# Patient Record
Sex: Female | Born: 1965 | Race: Black or African American | Hispanic: No | Marital: Single | Smoking: Never smoker
Health system: Southern US, Community
[De-identification: ages and names within clinical notes are randomized; demographics above are authoritative.]

## PROBLEM LIST (undated history)

## (undated) DIAGNOSIS — M5030 Other cervical disc degeneration, unspecified cervical region: Secondary | ICD-10-CM

## (undated) DIAGNOSIS — L810 Postinflammatory hyperpigmentation: Secondary | ICD-10-CM

## (undated) DIAGNOSIS — Z0282 Encounter for adoption services: Secondary | ICD-10-CM

## (undated) DIAGNOSIS — L219 Seborrheic dermatitis, unspecified: Secondary | ICD-10-CM

## (undated) DIAGNOSIS — L910 Hypertrophic scar: Secondary | ICD-10-CM

## (undated) DIAGNOSIS — Z789 Other specified health status: Secondary | ICD-10-CM

## (undated) DIAGNOSIS — D0510 Intraductal carcinoma in situ of unspecified breast: Secondary | ICD-10-CM

## (undated) DIAGNOSIS — T8859XA Other complications of anesthesia, initial encounter: Secondary | ICD-10-CM

## (undated) DIAGNOSIS — K7689 Other specified diseases of liver: Secondary | ICD-10-CM

## (undated) DIAGNOSIS — C50919 Malignant neoplasm of unspecified site of unspecified female breast: Secondary | ICD-10-CM

## (undated) DIAGNOSIS — D1800 Hemangioma unspecified site: Secondary | ICD-10-CM

## (undated) DIAGNOSIS — Z1371 Encounter for nonprocreative screening for genetic disease carrier status: Secondary | ICD-10-CM

## (undated) DIAGNOSIS — D251 Intramural leiomyoma of uterus: Secondary | ICD-10-CM

## (undated) DIAGNOSIS — R87610 Atypical squamous cells of undetermined significance on cytologic smear of cervix (ASC-US): Secondary | ICD-10-CM

## (undated) DIAGNOSIS — K219 Gastro-esophageal reflux disease without esophagitis: Secondary | ICD-10-CM

## (undated) DIAGNOSIS — Z8489 Family history of other specified conditions: Secondary | ICD-10-CM

## (undated) DIAGNOSIS — T4145XA Adverse effect of unspecified anesthetic, initial encounter: Secondary | ICD-10-CM

## (undated) DIAGNOSIS — M6780 Other specified disorders of synovium and tendon, unspecified site: Secondary | ICD-10-CM

## (undated) DIAGNOSIS — Z86718 Personal history of other venous thrombosis and embolism: Secondary | ICD-10-CM

## (undated) DIAGNOSIS — N946 Dysmenorrhea, unspecified: Secondary | ICD-10-CM

## (undated) HISTORY — DX: Other specified diseases of liver: K76.89

## (undated) HISTORY — DX: Encounter for nonprocreative screening for genetic disease carrier status: Z13.71

## (undated) HISTORY — DX: Atypical squamous cells of undetermined significance on cytologic smear of cervix (ASC-US): R87.610

## (undated) HISTORY — DX: Dysmenorrhea, unspecified: N94.6

## (undated) HISTORY — DX: Postinflammatory hyperpigmentation: L81.0

## (undated) HISTORY — PX: CHOLECYSTECTOMY: SHX55

## (undated) HISTORY — PX: KNEE ARTHROSCOPY: SUR90

## (undated) HISTORY — DX: Intraductal carcinoma in situ of unspecified breast: D05.10

## (undated) HISTORY — DX: Hemangioma unspecified site: D18.00

## (undated) HISTORY — DX: Other cervical disc degeneration, unspecified cervical region: M50.30

## (undated) HISTORY — DX: Intramural leiomyoma of uterus: D25.1

## (undated) HISTORY — DX: Other specified disorders of synovium and tendon, unspecified site: M67.80

## (undated) HISTORY — DX: Hypertrophic scar: L91.0

## (undated) HISTORY — DX: Seborrheic dermatitis, unspecified: L21.9

---

## 2005-01-24 ENCOUNTER — Ambulatory Visit: Payer: Self-pay | Admitting: General Surgery

## 2005-02-06 ENCOUNTER — Ambulatory Visit: Payer: Self-pay | Admitting: General Surgery

## 2005-07-11 ENCOUNTER — Ambulatory Visit: Payer: Self-pay | Admitting: General Surgery

## 2006-08-28 ENCOUNTER — Ambulatory Visit: Payer: Self-pay | Admitting: General Surgery

## 2008-10-20 DIAGNOSIS — C50919 Malignant neoplasm of unspecified site of unspecified female breast: Secondary | ICD-10-CM

## 2008-10-20 HISTORY — DX: Malignant neoplasm of unspecified site of unspecified female breast: C50.919

## 2008-10-20 HISTORY — PX: COMPLETE MASTECTOMY W/ SENTINEL NODE BIOPSY: SHX1383

## 2009-10-20 DIAGNOSIS — Z86718 Personal history of other venous thrombosis and embolism: Secondary | ICD-10-CM

## 2009-10-20 HISTORY — PX: BREAST RECONSTRUCTION: SHX9

## 2009-10-20 HISTORY — DX: Personal history of other venous thrombosis and embolism: Z86.718

## 2010-04-05 DIAGNOSIS — Z1371 Encounter for nonprocreative screening for genetic disease carrier status: Secondary | ICD-10-CM

## 2010-04-05 HISTORY — DX: Encounter for nonprocreative screening for genetic disease carrier status: Z13.71

## 2012-09-28 DIAGNOSIS — M6780 Other specified disorders of synovium and tendon, unspecified site: Secondary | ICD-10-CM

## 2012-09-28 HISTORY — DX: Other specified disorders of synovium and tendon, unspecified site: M67.80

## 2012-12-16 ENCOUNTER — Ambulatory Visit (HOSPITAL_COMMUNITY)
Admission: RE | Admit: 2012-12-16 | Discharge: 2012-12-16 | Disposition: A | Discharge status: Home / Self Care | Payer: Self-pay | Source: Ambulatory Visit | Attending: Internal Medicine | Admitting: Internal Medicine

## 2012-12-16 ENCOUNTER — Encounter (HOSPITAL_COMMUNITY): Payer: Self-pay | Admitting: Emergency Medicine

## 2012-12-16 ENCOUNTER — Emergency Department (HOSPITAL_COMMUNITY)
Admission: EM | Admit: 2012-12-16 | Discharge: 2012-12-16 | Disposition: A | Discharge status: Home / Self Care | Payer: Self-pay | Attending: Emergency Medicine | Admitting: Emergency Medicine

## 2012-12-16 DIAGNOSIS — Z862 Personal history of diseases of the blood and blood-forming organs and certain disorders involving the immune mechanism: Secondary | ICD-10-CM | POA: Insufficient documentation

## 2012-12-16 DIAGNOSIS — C50919 Malignant neoplasm of unspecified site of unspecified female breast: Secondary | ICD-10-CM | POA: Insufficient documentation

## 2012-12-16 DIAGNOSIS — M79609 Pain in unspecified limb: Secondary | ICD-10-CM | POA: Insufficient documentation

## 2012-12-16 DIAGNOSIS — Z853 Personal history of malignant neoplasm of breast: Secondary | ICD-10-CM | POA: Insufficient documentation

## 2012-12-16 DIAGNOSIS — Z86718 Personal history of other venous thrombosis and embolism: Secondary | ICD-10-CM | POA: Insufficient documentation

## 2012-12-16 DIAGNOSIS — M7989 Other specified soft tissue disorders: Secondary | ICD-10-CM

## 2012-12-16 DIAGNOSIS — Z79899 Other long term (current) drug therapy: Secondary | ICD-10-CM | POA: Insufficient documentation

## 2012-12-16 DIAGNOSIS — M79605 Pain in left leg: Secondary | ICD-10-CM

## 2012-12-16 HISTORY — DX: Malignant neoplasm of unspecified site of unspecified female breast: C50.919

## 2012-12-16 HISTORY — DX: Personal history of other venous thrombosis and embolism: Z86.718

## 2012-12-16 LAB — COMPREHENSIVE METABOLIC PANEL
Albumin: 3.6 g/dL (ref 3.5–5.2)
Alkaline Phosphatase: 77 U/L (ref 39–117)
BUN: 14 mg/dL (ref 6–23)
Calcium: 9.1 mg/dL (ref 8.4–10.5)
GFR calc Af Amer: >90 mL/min (ref >90)
Glucose, Bld: 97 mg/dL (ref 70–99)
Potassium: 4 mEq/L (ref 3.5–5.1)
Sodium: 137 mEq/L (ref 135–145)
Total Protein: 7.3 g/dL (ref 6.0–8.3)

## 2012-12-16 LAB — CBC WITH DIFFERENTIAL/PLATELET
Basophils Absolute: 0 10*3/uL (ref 0.0–0.1)
Basophils Relative: 0 % (ref 0–1)
Eosinophils Absolute: 0.1 10*3/uL (ref 0.0–0.7)
Eosinophils Relative: 1 % (ref 0–5)
HCT: 39.2 % (ref 36.0–46.0)
Hemoglobin: 13.4 g/dL (ref 12.0–15.0)
Lymphocytes Relative: 26 % (ref 12–46)
Lymphs Abs: 1.9 10*3/uL (ref 0.7–4.0)
MCH: 31.8 pg (ref 26.0–34.0)
MCHC: 34.2 g/dL (ref 30.0–36.0)
MCV: 93.1 fL (ref 78.0–100.0)
Monocytes Absolute: 0.5 10*3/uL (ref 0.1–1.0)
Monocytes Relative: 7 % (ref 3–12)
Neutro Abs: 4.6 10*3/uL (ref 1.7–7.7)
Neutrophils Relative %: 66 % (ref 43–77)
Platelets: 287 10*3/uL (ref 150–400)
RBC: 4.21 MIL/uL (ref 3.87–5.11)
RDW: 12.2 % (ref 11.5–15.5)
WBC: 7 10*3/uL (ref 4.0–10.5)

## 2012-12-16 MED ORDER — ENOXAPARIN SODIUM 80 MG/0.8ML ~~LOC~~ SOLN
80.0000 mg | Freq: Once | SUBCUTANEOUS | Status: AC
  Administered 2012-12-16: 80 mg via SUBCUTANEOUS
  Filled 2012-12-16: qty 0.8

## 2012-12-16 NOTE — ED Notes (Signed)
Patient complaining of left leg swelling behind knee and pain in left calf/shin that started this morning; patient reports that she has had a history of blood clots in her left arm (one year ago).  Patient denies taking blood thinners.

## 2012-12-16 NOTE — ED Provider Notes (Signed)
History     CSN: [id number]  Arrival date & time 12/16/12  0217   First MD Initiated Contact with Patient 12/16/12 [phone]      Chief Complaint  Patient presents with  . Leg Swelling    (Consider location/radiation/quality/duration/timing/severity/associated sxs/prior treatment) HPI Comments: Beth Branch is a 47 y.o. female with a history of breast cancer bilateral mastectomy and postsurgical DVT presents emergency department complaining of left lower the posterior knee and/and pain.  Onset of symptoms began yesterday and persisted through the morning.  Patient denies any warmth, swelling, erythema or edema of extremity, but she does report that pain is similar to the type of pain she had with her previous blood clot.  Patient is currently not on blood thinners and denies chest pain, shortness of breath, palpitations, cough, hemoptysis.  Patient has a followup appointment scheduled with her oncologist on Friday to the long drive and she decided to get evaluated prior to the long drive as she knows the risks of having DVTs.  No other complaints at this time.  The history is provided by the patient.    Past Medical History  Diagnosis Date  . History of blood clots   . Breast cancer     Past Surgical History  Procedure Laterality Date  . Breast surgery      History reviewed. No pertinent family history.  History  Substance Use Topics  . Smoking status: Never Smoker   . Smokeless tobacco: Not on file  . Alcohol Use: No    OB History   Grav Para Term Preterm Abortions TAB SAB Ect Mult Living                  Review of Systems  All other systems reviewed and are negative.    Allergies  Review of patient's allergies indicates no known allergies.  Home Medications   Current Outpatient Rx  Name  Route  Sig  Dispense  Refill  . minocycline (MINOCIN,DYNACIN) 100 MG capsule   Oral   Take 100 mg by mouth 2 (two) times daily. For acne prevention         . tretinoin  (RETIN-A) 0.05 % cream   Topical   Apply 1 application topically at bedtime.           BP 142/72  Pulse 98  Temp(Src) 99.1 F (37.3 C) (Oral)  Resp 16  SpO2 100%  LMP 12/16/2012  Physical Exam  Nursing note and vitals reviewed. Constitutional: She is oriented to person, place, and time. She appears well-developed and well-nourished. No distress.  HENT:  Head: Normocephalic and atraumatic.  Eyes: Conjunctivae and EOM are normal.  Neck: Normal range of motion. Neck supple.  Cardiovascular:  Intact distal pulses, capillary refill < 3 seconds.  No pitting edema  Pulmonary/Chest: Effort normal.  Musculoskeletal: Normal range of motion.  All extremities with normal ROM.  Left lower extremity nontender to palpation.  Neurological: She is alert and oriented to person, place, and time.  No sensory deficit  Skin: Skin is warm and dry. No rash noted. She is not diaphoretic.  No warmth, erythema or edema seen  Psychiatric: She has a normal mood and affect. Her behavior is normal.    ED Course  Procedures (including critical care time)  Labs Reviewed  CBC WITH DIFFERENTIAL  COMPREHENSIVE METABOLIC PANEL   No results found.   No diagnosis found.    MDM  Patient is a 47 year old female with a history of breast cancer  and a blood clot status post mastectomy that presents to the emergency department complaining of left lower trauma the posterior knee calf/shin pain that began yesterday with mild associated swelling that has since resolved.  Based on patient's history she'll be discharged with DVT treatment and return for ultrasound in the morning.  Case discussed with attending Dr. Freida Busman who agrees with treatment plan.  Patient throughout hospital stay and at time of discharge has no signs or concerns for pulmonary embolism including chest pain, shortness of breath, tachycardia, cough, hemoptysis or hypoxia.  Patient is currently stable and in no acute  distress.        Jaci Carrel, [place] 12/16/12 [phone]

## 2012-12-16 NOTE — Progress Notes (Signed)
Left:  No evidence of DVT, superficial thrombosis, or Baker's cyst.  Right:  Negative for DVT in the common femoral vein.  

## 2012-12-16 NOTE — ED Notes (Signed)
Pt discharged.Vital signs stable and GCS 15 

## 2012-12-16 NOTE — ED Notes (Signed)
The pt has a sl red swollen area to the popiteal area of her lt leg.  Temperature of that area the same as the rest of the leg and the other leg.. She does not remember a biteand she does not feel real pain just an aching intermittently just today.

## 2012-12-20 NOTE — ED Provider Notes (Signed)
Medical screening examination/treatment/procedure(s) were conducted as a shared visit with non-physician practitioner(s) and myself.  I personally evaluated the patient during the encounter  Toy Baker, MD 12/20/12 [phone]

## 2013-08-15 ENCOUNTER — Ambulatory Visit: Payer: Self-pay | Admitting: General Surgery

## 2013-09-13 ENCOUNTER — Encounter: Payer: Self-pay | Admitting: *Deleted

## 2013-10-11 DIAGNOSIS — Z124 Encounter for screening for malignant neoplasm of cervix: Secondary | ICD-10-CM | POA: Insufficient documentation

## 2013-10-11 DIAGNOSIS — Z853 Personal history of malignant neoplasm of breast: Secondary | ICD-10-CM | POA: Insufficient documentation

## 2013-10-11 DIAGNOSIS — Z6828 Body mass index (BMI) 28.0-28.9, adult: Secondary | ICD-10-CM | POA: Insufficient documentation

## 2013-10-11 DIAGNOSIS — D251 Intramural leiomyoma of uterus: Secondary | ICD-10-CM | POA: Insufficient documentation

## 2013-10-11 DIAGNOSIS — N946 Dysmenorrhea, unspecified: Secondary | ICD-10-CM

## 2013-10-11 HISTORY — DX: Dysmenorrhea, unspecified: N94.6

## 2013-10-11 HISTORY — DX: Intramural leiomyoma of uterus: D25.1

## 2013-10-12 DIAGNOSIS — Z9889 Other specified postprocedural states: Secondary | ICD-10-CM | POA: Insufficient documentation

## 2013-12-12 DIAGNOSIS — L810 Postinflammatory hyperpigmentation: Secondary | ICD-10-CM

## 2013-12-12 DIAGNOSIS — L709 Acne, unspecified: Secondary | ICD-10-CM | POA: Insufficient documentation

## 2013-12-12 HISTORY — DX: Postinflammatory hyperpigmentation: L81.0

## 2013-12-26 DIAGNOSIS — L910 Hypertrophic scar: Secondary | ICD-10-CM

## 2013-12-26 HISTORY — DX: Hypertrophic scar: L91.0

## 2014-03-01 DIAGNOSIS — L02413 Cutaneous abscess of right upper limb: Secondary | ICD-10-CM | POA: Insufficient documentation

## 2014-03-01 DIAGNOSIS — L219 Seborrheic dermatitis, unspecified: Secondary | ICD-10-CM

## 2014-03-01 HISTORY — DX: Seborrheic dermatitis, unspecified: L21.9

## 2014-03-30 ENCOUNTER — Ambulatory Visit: Payer: Self-pay | Admitting: Emergency Medicine

## 2014-04-18 ENCOUNTER — Ambulatory Visit: Payer: Self-pay | Admitting: Internal Medicine

## 2014-12-30 ENCOUNTER — Ambulatory Visit: Payer: Self-pay | Admitting: Family Medicine

## 2015-01-04 ENCOUNTER — Ambulatory Visit: Payer: Self-pay | Admitting: Family Medicine

## 2015-08-20 DIAGNOSIS — R87610 Atypical squamous cells of undetermined significance on cytologic smear of cervix (ASC-US): Secondary | ICD-10-CM

## 2015-08-20 DIAGNOSIS — N898 Other specified noninflammatory disorders of vagina: Secondary | ICD-10-CM | POA: Insufficient documentation

## 2015-08-20 HISTORY — DX: Atypical squamous cells of undetermined significance on cytologic smear of cervix (ASC-US): R87.610

## 2015-08-27 ENCOUNTER — Ambulatory Visit
Admission: EM | Admit: 2015-08-27 | Discharge: 2015-08-27 | Disposition: A | Discharge status: Home / Self Care | Payer: No Typology Code available for payment source | Attending: Family Medicine | Admitting: Family Medicine

## 2015-08-27 DIAGNOSIS — M25512 Pain in left shoulder: Secondary | ICD-10-CM | POA: Diagnosis not present

## 2015-08-27 DIAGNOSIS — M79622 Pain in left upper arm: Secondary | ICD-10-CM

## 2015-08-27 MED ORDER — IBUPROFEN 800 MG PO TABS: 800.0000 mg | ORAL_TABLET | Freq: Three times a day (TID) | ORAL | Status: DC | PRN

## 2015-08-27 NOTE — Discharge Instructions (Signed)
Cryotherapy °Cryotherapy means treatment with cold. Ice or gel packs can be used to reduce both pain and swelling. Ice is the most helpful within the first 24 to 48 hours after an injury or flare-up from overusing a muscle or joint. Sprains, strains, spasms, burning pain, shooting pain, and aches can all be eased with ice. Ice can also be used when recovering from surgery. Ice is effective, has very few side effects, and is safe for most people to use. °PRECAUTIONS  °Ice is not a safe treatment option for people with: °· Raynaud phenomenon. This is a condition affecting small blood vessels in the extremities. Exposure to cold may cause your problems to return. °· Cold hypersensitivity. There are many forms of cold hypersensitivity, including: °· Cold urticaria. Red, itchy hives appear on the skin when the tissues begin to warm after being iced. °· Cold erythema. This is a red, itchy rash caused by exposure to cold. °· Cold hemoglobinuria. Red blood cells break down when the tissues begin to warm after being iced. The hemoglobin that carry oxygen are passed into the urine because they cannot combine with blood proteins fast enough. °· Numbness or altered sensitivity in the area being iced. °If you have any of the following conditions, do not use ice until you have discussed cryotherapy with your caregiver: °· Heart conditions, such as arrhythmia, angina, or chronic heart disease. °· High blood pressure. °· Healing wounds or open skin in the area being iced. °· Current infections. °· Rheumatoid arthritis. °· Poor circulation. °· Diabetes. °Ice slows the blood flow in the region it is applied. This is beneficial when trying to stop inflamed tissues from spreading irritating chemicals to surrounding tissues. However, if you expose your skin to cold temperatures for too long or without the proper protection, you can damage your skin or nerves. Watch for signs of skin damage due to cold. °HOME CARE INSTRUCTIONS °Follow  these tips to use ice and cold packs safely. °· Place a dry or damp towel between the ice and skin. A damp towel will cool the skin more quickly, so you may need to shorten the time that the ice is used. °· For a more rapid response, add gentle compression to the ice. °· Ice for no more than 10 to 20 minutes at a time. The bonier the area you are icing, the less time it will take to get the benefits of ice. °· Check your skin after 5 minutes to make sure there are no signs of a poor response to cold or skin damage. °· Rest 20 minutes or more between uses. °· Once your skin is numb, you can end your treatment. You can test numbness by very lightly touching your skin. The touch should be so light that you do not see the skin dimple from the pressure of your fingertip. When using ice, most people will feel these normal sensations in this order: cold, burning, aching, and numbness. °· Do not use ice on someone who cannot communicate their responses to pain, such as small children or people with dementia. °HOW TO MAKE AN ICE PACK °Ice packs are the most common way to use ice therapy. Other methods include ice massage, ice baths, and cryosprays. Muscle creams that cause a cold, tingly feeling do not offer the same benefits that ice offers and should not be used as a substitute unless recommended by your caregiver. °To make an ice pack, do one of the following: °· Place crushed ice or a   bag of frozen vegetables in a sealable plastic bag. Squeeze out the excess air. Place this bag inside another plastic bag. Slide the bag into a pillowcase or place a damp towel between your skin and the bag.  Mix 3 parts water with 1 part rubbing alcohol. Freeze the mixture in a sealable plastic bag. When you remove the mixture from the freezer, it will be slushy. Squeeze out the excess air. Place this bag inside another plastic bag. Slide the bag into a pillowcase or place a damp towel between your skin and the bag. SEEK MEDICAL CARE  IF:  You develop white spots on your skin. This may give the skin a blotchy (mottled) appearance.  Your skin turns blue or pale.  Your skin becomes waxy or hard.  Your swelling gets worse. MAKE SURE YOU:   Understand these instructions.  Will watch your condition.  Will get help right away if you are not doing well or get worse.   This information is not intended to replace advice given to you by your health care provider. Make sure you discuss any questions you have with your health care provider.   Document Released: 06/02/2011 Document Revised: 10/27/2014 Document Reviewed: 06/02/2011 Elsevier Interactive Patient Education 2016 [place]. Muscle Strain A muscle strain is an injury that occurs when a muscle is stretched beyond its normal length. Usually a small number of muscle fibers are torn when this happens. Muscle strain is rated in degrees. First-degree strains have the least amount of muscle fiber tearing and pain. Second-degree and third-degree strains have increasingly more tearing and pain.  Usually, recovery from muscle strain takes 1-2 weeks. Complete healing takes 5-6 weeks.  CAUSES  Muscle strain happens when a sudden, violent force placed on a muscle stretches it too far. This may occur with lifting, sports, or a fall.  RISK FACTORS Muscle strain is especially common in athletes.  SIGNS AND SYMPTOMS At the site of the muscle strain, there may be:  Pain.  Bruising.  Swelling.  Difficulty using the muscle due to pain or lack of normal function. DIAGNOSIS  Your health care provider will perform a physical exam and ask about your medical history. TREATMENT  Often, the best treatment for a muscle strain is resting, icing, and applying cold compresses to the injured area.  HOME CARE INSTRUCTIONS   Use the PRICE method of treatment to promote muscle healing during the first 2-3 days after your injury. The PRICE method involves:  Protecting the muscle from  being injured again.  Restricting your activity and resting the injured body part.  Icing your injury. To do this, put ice in a plastic bag. Place a towel between your skin and the bag. Then, apply the ice and leave it on from 15-20 minutes each hour. After the third day, switch to moist heat packs.  Apply compression to the injured area with a splint or elastic bandage. Be careful not to wrap it too tightly. This may interfere with blood circulation or increase swelling.  Elevate the injured body part above the level of your heart as often as you can.  Only take over-the-counter or prescription medicines for pain, discomfort, or fever as directed by your health care provider.  Warming up prior to exercise helps to prevent future muscle strains. SEEK MEDICAL CARE IF:   You have increasing pain or swelling in the injured area.  You have numbness, tingling, or a significant loss of strength in the injured area. MAKE SURE YOU:  Understand these instructions.  Will watch your condition.  Will get help right away if you are not doing well or get worse.   This information is not intended to replace advice given to you by your health care provider. Make sure you discuss any questions you have with your health care provider.   Document Released: 10/06/2005 Document Revised: 07/27/2013 Document Reviewed: 05/05/2013 Elsevier Interactive Patient Education 2016 [place] A contusion is a deep bruise. Contusions are the result of a blunt injury to tissues and muscle fibers under the skin. The injury causes bleeding under the skin. The skin overlying the contusion may turn blue, purple, or yellow. Minor injuries will give you a painless contusion, but more severe contusions may stay painful and swollen for a few weeks.  CAUSES  This condition is usually caused by a blow, trauma, or direct force to an area of the body. SYMPTOMS  Symptoms of this condition include:  Swelling of  the injured area.  Pain and tenderness in the injured area.  Discoloration. The area may have redness and then turn blue, purple, or yellow. DIAGNOSIS  This condition is diagnosed based on a physical exam and medical history. An X-ray, CT scan, or MRI may be needed to determine if there are any associated injuries, such as broken bones (fractures). TREATMENT  Specific treatment for this condition depends on what area of the body was injured. In general, the best treatment for a contusion is resting, icing, applying pressure to (compression), and elevating the injured area. This is often called the RICE strategy. Over-the-counter anti-inflammatory medicines may also be recommended for pain control.  HOME CARE INSTRUCTIONS   Rest the injured area.  If directed, apply ice to the injured area:  Put ice in a plastic bag.  Place a towel between your skin and the bag.  Leave the ice on for 20 minutes, 2-3 times per day.  If directed, apply light compression to the injured area using an elastic bandage. Make sure the bandage is not wrapped too tightly. Remove and reapply the bandage as directed by your health care provider.  If possible, raise (elevate) the injured area above the level of your heart while you are sitting or lying down.  Take over-the-counter and prescription medicines only as told by your health care provider. SEEK MEDICAL CARE IF:  Your symptoms do not improve after several days of treatment.  Your symptoms get worse.  You have difficulty moving the injured area. SEEK IMMEDIATE MEDICAL CARE IF:   You have severe pain.  You have numbness in a hand or foot.  Your hand or foot turns pale or cold.   This information is not intended to replace advice given to you by your health care provider. Make sure you discuss any questions you have with your health care provider.   Document Released: 07/16/2005 Document Revised: 06/27/2015 Document Reviewed: 02/21/2015 Elsevier  Interactive Patient Education [employer].

## 2015-08-27 NOTE — ED Notes (Signed)
Patient states that she had breast cancer in right breast cancer 5 years ago and she had a mastectomy on both sides. She states that she went for her annual exam and she states that they wanted to ultrasound the left axillary area and she had an area that they said was thickened but that it looked more vascular. She states that since that appointment she has had pain that has worsened since her ultrasound. She states that her Doctor is in [place] and she has not been responding to patient calls or emails. Patient states that she is worried and would like for someone here to just take a peek at the area. Patient states that pain radiates into chest and in arm. She states that the pain is more of an ache.

## 2015-08-28 ENCOUNTER — Ambulatory Visit
Admission: RE | Admit: 2015-08-28 | Discharge: 2015-08-28 | Disposition: A | Discharge status: Home / Self Care | Payer: No Typology Code available for payment source | Source: Ambulatory Visit | Attending: Registered Nurse | Admitting: Registered Nurse

## 2015-08-28 DIAGNOSIS — M79622 Pain in left upper arm: Secondary | ICD-10-CM | POA: Insufficient documentation

## 2015-08-28 NOTE — ED Provider Notes (Signed)
CSN: [id number]     Arrival date & time 08/27/15  1722 History   First MD Initiated Contact with Patient 08/27/15 1842     Chief Complaint  Patient presents with  . Axillary Pain    (Consider location/radiation/quality/duration/timing/severity/associated sxs/prior Treatment) HPI Comments: African [place] female here for evaluation of swelling and pain left axilla that started after her follow up appt at [place] with her oncologist 18 Aug 2015 Dr Oppang  [place] 28 Oct.  Patient and provider unable to feel same lump she identified for her breast cancer follow up appt [place] ordered and completed by multiple techs/radiologist and clinical breast exams performed by multiple providers that week.  Lump identified at 1 oclock position superiorolateral breast tissue Patient has had history of folliculitis in left armpit.  She is unable to feel lump she identified for oncologist now resolved after LMP 10 Aug 2015.  Was told 3mm probable vascular change in area from previous exams and to follow up in 90 days for repeat evaluation but at the time was not painful or swollen.  Patient concerned something has changed and wants re-evaluation today due to swelling and pain left axilla became sore/puffy and tender last Tuesday.  Loose bowels this past week also over a couple days now resolved.  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    Past Medical History  Diagnosis Date  . History of blood clots   . Breast cancer [facility])    Past Surgical History  Procedure Laterality Date  . Complete mastectomy w/ sentinel node biopsy Bilateral 2010   Family History  Problem Relation Age of Onset  . Adopted: Yes   Social History  Substance Use Topics  .  Smoking status: Never Smoker   . Smokeless tobacco: None  . Alcohol Use: No   OB History    No data available     Review of Systems  Constitutional: Negative for fever, chills, diaphoresis, activity change, appetite change, fatigue and unexpected weight change.  HENT: Negative for congestion, dental problem, drooling, ear discharge, ear pain, facial swelling, hearing loss, mouth sores, nosebleeds, postnasal drip, sore throat, trouble swallowing and voice change.   Eyes: Negative for photophobia, pain, discharge, redness, itching and visual disturbance.  Respiratory: Negative for cough, choking, chest tightness, shortness of breath, wheezing and stridor.   Cardiovascular: Negative for chest pain, palpitations and leg swelling.  Gastrointestinal: Positive for diarrhea. Negative for nausea, vomiting, abdominal pain, constipation, blood in stool and abdominal distention.  Endocrine: Negative for cold intolerance and heat intolerance.  Genitourinary: Negative for dysuria, hematuria and difficulty urinating.  Musculoskeletal: Positive for myalgias. Negative for back pain, joint swelling, arthralgias, gait problem, neck pain and neck stiffness.  Skin: Negative for color change, pallor, rash and wound.  Allergic/Immunologic: Negative for environmental allergies, food allergies and immunocompromised state.  Neurological: Negative for dizziness, tremors, seizures, syncope, facial asymmetry, speech difficulty, weakness, light-headedness, numbness and headaches.  Hematological: Positive for adenopathy. Does not bruise/bleed easily.  Psychiatric/Behavioral: Negative for behavioral problems, confusion, sleep disturbance and agitation.    Allergies  Review of patient's allergies indicates no known allergies.  Home Medications   Prior to Admission medications   Medication Sig Start Date End Date Taking? Authorizing Provider  ibuprofen (ADVIL,MOTRIN) 800 MG tablet Take 1 tablet (800 mg total) by mouth  every 8 (eight) hours as needed for mild pain or moderate pain. 08/27/15   Olen Cordial, NP   Meds Ordered and Administered this Visit  Medications - No data to display  BP 130/90 mmHg  Pulse 84  Temp(Src) 99.5 F (37.5 C) (Tympanic)  Resp 15  Ht 5\' 7"  (1.702 m)  Wt 178 lb (80.74 kg)  BMI 27.87 kg/m2  SpO2 100%  LMP 08/10/2015 (Approximate) No data found.   Physical Exam  Constitutional: She is oriented to person, place, and time. Vital signs are normal. She appears well-developed and well-nourished. She is active and cooperative.  Non-toxic appearance. She does not have a sickly appearance. She does not appear ill. No distress.  HENT:  Head: Normocephalic and atraumatic.  Right Ear: Hearing and external ear normal.  Left Ear: Hearing and external ear normal.  Nose: Nose normal. No mucosal edema, rhinorrhea, nose lacerations, sinus tenderness or nasal deformity. Right sinus exhibits no maxillary sinus tenderness and no frontal sinus tenderness. Left sinus exhibits no maxillary sinus tenderness and no frontal sinus tenderness.  Mouth/Throat: Uvula is midline, oropharynx is clear and moist and mucous membranes are normal. Mucous membranes are not pale, not dry and not cyanotic. She does not have dentures. No oral lesions. No trismus in the jaw. Normal dentition. No dental abscesses, uvula swelling, lacerations or dental caries. No oropharyngeal exudate, posterior oropharyngeal edema, posterior oropharyngeal erythema or tonsillar abscesses.  Eyes: Conjunctivae, EOM and lids are normal. Pupils are equal, round, and reactive to light. Right eye exhibits no chemosis, no discharge, no exudate and no hordeolum. No foreign body present in the right eye. Left eye exhibits no chemosis, no discharge, no exudate and no hordeolum. No foreign body present in the left eye. Right conjunctiva is not injected. Right conjunctiva has no hemorrhage. Left conjunctiva is not injected. Left conjunctiva has no  hemorrhage. No scleral icterus. Right eye exhibits normal extraocular motion and no nystagmus. Left eye exhibits normal extraocular motion and no nystagmus. Right pupil is round and reactive. Left pupil is round and reactive. Pupils are equal.  Neck: Trachea normal and normal range of motion. Neck supple. No tracheal tenderness, no spinous process tenderness and no muscular tenderness present. No rigidity. No tracheal deviation, no edema, no erythema and normal range of motion present. No thyroid mass and no thyromegaly present.  Cardiovascular: Normal rate, regular rhythm, S1 normal, S2 normal, normal heart sounds and intact distal pulses.  PMI is not displaced.  Exam reveals no gallop and no friction rub.   No murmur heard. Pulmonary/Chest: Effort normal and breath sounds normal. No accessory muscle usage or stridor. No respiratory distress. She has no decreased breath sounds. She has no wheezes. She has no rhonchi. She has no rales. She exhibits no tenderness.  Abdominal: Soft. Bowel sounds are normal. She exhibits no shifting dullness, no distension, no pulsatile liver, no fluid wave, no abdominal bruit, no ascites, no pulsatile midline mass and no mass. There is no tenderness. There is no rigidity, no rebound, no guarding, no tenderness at McBurney's point and negative Murphy's sign. Hernia confirmed negative in the ventral area.  Dull to percussion x 4 quads  Musculoskeletal: Normal range of motion. She exhibits edema and tenderness.       Right shoulder: Normal.       Left shoulder: Normal.       Right elbow: Normal.  Left elbow: Normal.       Right wrist: Normal.       Left wrist: Normal.       Cervical back: Normal.       Thoracic back: Normal.       Right upper arm: Normal.       Left upper arm: She exhibits tenderness and swelling. She exhibits no bony tenderness, no edema, no deformity and no laceration.       Right forearm: Normal.       Left forearm: Normal.       Arms:       Right hand: Normal.       Left hand: Normal.  Lymphadenopathy:       Head (right side): No submental, no submandibular, no tonsillar, no preauricular, no posterior auricular and no occipital adenopathy present.       Head (left side): No submental, no submandibular, no tonsillar, no preauricular, no posterior auricular and no occipital adenopathy present.    She has no cervical adenopathy.       Right cervical: No superficial cervical, no deep cervical and no posterior cervical adenopathy present.      Left cervical: No superficial cervical, no deep cervical and no posterior cervical adenopathy present.       Right axillary: No pectoral and no lateral adenopathy present.  0-1+ nonpitting edema anterior ridge axilla superiorolateral to pectoral muscle left  Neurological: She is alert and oriented to person, place, and time. She is not disoriented. She displays no atrophy and no tremor. No cranial nerve deficit or sensory deficit. She exhibits normal muscle tone. She displays no seizure activity. Coordination and gait normal. GCS eye subscore is 4. GCS verbal subscore is 5. GCS motor subscore is 6.  Skin: Skin is warm, dry and intact. No abrasion, no bruising, no burn, no ecchymosis, no laceration, no lesion, no petechiae and no rash noted. She is not diaphoretic. No cyanosis or erythema. No pallor. Nails show no clubbing.     TTP 2cm diameter area superioromedial axilla left 0-1+ nonpitting edema compared to right; central left axilla scar tissue palpated 3-29mm nontender no discernable nodules, papules, vesicles, erythema, ecchymosis  Psychiatric: She has a normal mood and affect. Her speech is normal and behavior is normal. Judgment and thought content normal. Cognition and memory are normal.  Nursing note and vitals reviewed.   ED Course  Procedures (including critical care time)  Labs Review Labs Reviewed - No data to display  Imaging Review [place] Extrem Up Left Ltd  08/28/2015  CLINICAL  DATA:  Left axillary pain. EXAM: ULTRASOUND LEFT UPPER EXTREMITY LIMITED TECHNIQUE: Ultrasound examination of the upper extremity soft tissues was performed in the area of clinical concern. COMPARISON:  No prior. FINDINGS: A 9 mm hypodensity with hyperechoic core consistent with small lymph node is noted in the left axilla. The lymph node has a benign appearance . No other focal abnormality identified. IMPRESSION: Tiny 9 mm lymph node in the left axilla. No other focal abnormality identified . Electronically Signed   By: Marcello Moores  Register   On: 08/28/2015 11:18   1232 28 Aug 2015 Telephone message left for patient regarding [place] results.   Appears benign consistent with her previous US results that she reported from [place].  Patient may pick up copy of radiology and report from [facility] during business hours if she desires copy.  Continue plan of care as previously discussed.     MDM  1. Axillary pain, left    Suspect contusion from prolonged ultrasound 18 Aug 2015 follow up with Dr Mohammed Kindle  [facility] [place]  29 Oct for breast cancer.  DDx muscle strain, recurrent breast cancer, cellulitis, soft tissue injury from prolonged and multiple exams/ultrasonographers end of Oct and patient performing repetitive self axillary exams.  Will call patient with results once available typically 4 hours after scan.  Continue to monitor for bruising, worsening swelling, rash, erythema, fever and follow up for re-evaluation if these symptoms occur.  Keep 90 day follow up with oncologist at [facility] as previously scheduled sooner if changing symptoms.  May take motrin 800mg  po TID prn pain/swelling.  Ice TID to affected area.  Avoid strenuous workouts until symptoms resolved.  Exitcare handout on contusion, muscle strain given to patient.  Patient verbalized understanding of information/instructions, agreed with plan of care and had no further questions at this  time.    Olen Cordial, NP 08/28/15 1242

## 2015-09-04 ENCOUNTER — Encounter: Payer: Self-pay | Admitting: General Surgery

## 2015-09-04 ENCOUNTER — Ambulatory Visit (INDEPENDENT_AMBULATORY_CARE_PROVIDER_SITE_OTHER): Payer: No Typology Code available for payment source | Admitting: General Surgery

## 2015-09-04 VITALS — BP 124/70 | HR 74 | Resp 12 | Ht 67.0 in | Wt 175.0 lb

## 2015-09-04 DIAGNOSIS — R590 Localized enlarged lymph nodes: Secondary | ICD-10-CM

## 2015-09-04 DIAGNOSIS — D0511 Intraductal carcinoma in situ of right breast: Secondary | ICD-10-CM | POA: Diagnosis not present

## 2015-09-04 NOTE — Progress Notes (Signed)
Patient ID: Beth Branch, female   DOB: 1966/05/01, 49 y.o.   MRN: [id number]  Chief Complaint  Patient presents with  . Pain    left axillary area    HPI Beth Branch is a 49 y.o. female.  Here today for evaluation of left axillary pain with lymph node. She states she can feel some nodules in the left axillary area since last month while in [place]. She did have an ultrasound showing several lymph nodes, one of which was considered suspicious (at the 1:00 position). The pain is described as tenderness. She then went to [facility] and had another ultrasound on 08-28-15 showing questionable growth of lymph node. No biopsies have been done at this time.  Hx of Right DCIS post b/l mastectomy and 6 months of tamoxifen. When she was seen for core biopsy of the lymph node on left at 1 ocl location this appeared vascular and very small. # mo f/u was recommended. I have reviewed the history of present illness with the patient. HPI  Past Medical History  Diagnosis Date  . History of blood clots   . Breast cancer [facility]) 2010    right breast DCIS    Past Surgical History  Procedure Laterality Date  . Complete mastectomy w/ sentinel node biopsy Bilateral 2010    Right breast DCIS  . Breast reconstruction Bilateral 2011    Dr Greta Doom in [place]    Family History  Problem Relation Age of Onset  . Adopted: Yes    Social History Social History  Substance Use Topics  . Smoking status: Never Smoker   . Smokeless tobacco: Never Used  . Alcohol Use: No    No Known Allergies  Current Outpatient Prescriptions  Medication Sig Dispense Refill  . b complex vitamins capsule Take by mouth.    Marland Kitchen ibuprofen (ADVIL,MOTRIN) 800 MG tablet Take 1 tablet (800 mg total) by mouth every 8 (eight) hours as needed for mild pain or moderate pain. 30 tablet 0  . Lactobacillus Rhamnosus, GG, (CULTURELLE IMMUNITY SUPPORT) CAPS Take by mouth.    . magnesium 30 MG tablet Take by mouth.    . Multiple  Vitamins-Minerals (HAIR SKIN AND NAILS FORMULA PO) Take by mouth daily.    . Omega-3 Fatty Acids (KP FISH OIL) 1200 MG CAPS Take by mouth.     No current facility-administered medications for this visit.    Review of Systems Review of Systems  Constitutional: Negative.   Respiratory: Negative.   Cardiovascular: Negative.     Blood pressure 124/70, pulse 74, resp. rate 12, height 5\' 7"  (1.702 m), weight 175 lb (79.379 kg), last menstrual period 09/04/2015.  Physical Exam Physical Exam  Constitutional: She is oriented to person, place, and time. She appears well-developed and well-nourished.  HENT:  Mouth/Throat: Oropharynx is clear and moist. No oropharyngeal exudate.  Eyes: Conjunctivae are normal. No scleral icterus.  Neck: Neck supple.  Pulmonary/Chest: Left breast exhibits no inverted nipple, no mass, no nipple discharge, no skin change and no tenderness.  B/l mastectomy with reconstruction sites well healed.   Lymphadenopathy:    She has no cervical adenopathy.    She has no axillary adenopathy.  Neurological: She is alert and oriented to person, place, and time.  Skin: Skin is warm and dry.  Psychiatric: Her behavior is normal.    Data Reviewed Progress notes and previous two ultrasound reports reviewed  Assessment    Axillary lymphadenopathy, left - likely transient and benign as no  nodes are palpable at this time.  Hx of right breast DCIS post b/l mastectomy with reconstruction.      Plan    Reassured patient. Follow up in 3 months for re-check and ultrasound of left axilla.        PCP none  SANKAR,SEEPLAPUTHUR G 09/04/2015, 12:49 PM

## 2015-09-04 NOTE — Patient Instructions (Addendum)
The patient is aware to call back for any questions or concerns.  Follow up in 3 months for reassessment. Use heating pad and OTC pain relievers for any tenderness in left axilla.

## 2015-09-11 DIAGNOSIS — D251 Intramural leiomyoma of uterus: Secondary | ICD-10-CM

## 2015-09-11 DIAGNOSIS — Z853 Personal history of malignant neoplasm of breast: Secondary | ICD-10-CM | POA: Insufficient documentation

## 2015-09-11 HISTORY — DX: Intramural leiomyoma of uterus: D25.1

## 2015-09-14 DIAGNOSIS — Z8742 Personal history of other diseases of the female genital tract: Secondary | ICD-10-CM | POA: Insufficient documentation

## 2015-09-19 DIAGNOSIS — D0510 Intraductal carcinoma in situ of unspecified breast: Secondary | ICD-10-CM

## 2015-09-19 HISTORY — DX: Intraductal carcinoma in situ of unspecified breast: D05.10

## 2015-09-24 ENCOUNTER — Ambulatory Visit (INDEPENDENT_AMBULATORY_CARE_PROVIDER_SITE_OTHER)
Admit: 2015-09-24 | Discharge: 2015-09-24 | Disposition: A | Discharge status: Home / Self Care | Payer: No Typology Code available for payment source | Attending: Family Medicine | Admitting: Family Medicine

## 2015-09-24 ENCOUNTER — Ambulatory Visit
Admission: EM | Admit: 2015-09-24 | Discharge: 2015-09-24 | Disposition: A | Discharge status: Home / Self Care | Payer: No Typology Code available for payment source | Attending: Family Medicine | Admitting: Family Medicine

## 2015-09-24 ENCOUNTER — Encounter: Payer: Self-pay | Admitting: Emergency Medicine

## 2015-09-24 DIAGNOSIS — Z86718 Personal history of other venous thrombosis and embolism: Secondary | ICD-10-CM | POA: Diagnosis not present

## 2015-09-24 DIAGNOSIS — M79602 Pain in left arm: Secondary | ICD-10-CM | POA: Diagnosis not present

## 2015-09-24 DIAGNOSIS — M79622 Pain in left upper arm: Secondary | ICD-10-CM

## 2015-09-24 DIAGNOSIS — Z853 Personal history of malignant neoplasm of breast: Secondary | ICD-10-CM

## 2015-09-24 DIAGNOSIS — Z86000 Personal history of in-situ neoplasm of breast: Secondary | ICD-10-CM

## 2015-09-24 NOTE — Discharge Instructions (Signed)
Cryotherapy Cryotherapy is when you put ice on your injury. Ice helps lessen pain and puffiness (swelling) after an injury. Ice works the best when you start using it in the first 24 to 48 hours after an injury. HOME CARE  Put a dry or damp towel between the ice pack and your skin.  You may press gently on the ice pack.  Leave the ice on for no more than 10 to 20 minutes at a time.  Check your skin after 5 minutes to make sure your skin is okay.  Rest at least 20 minutes between ice pack uses.  Stop using ice when your skin loses feeling (numbness).  Do not use ice on someone who cannot tell you when it hurts. This includes small children and people with memory problems (dementia). GET HELP RIGHT AWAY IF:  You have white spots on your skin.  Your skin turns blue or pale.  Your skin feels waxy or hard.  Your puffiness gets worse. MAKE SURE YOU:   Understand these instructions.  Will watch your condition.  Will get help right away if you are not doing well or get worse.   This information is not intended to replace advice given to you by your health care provider. Make sure you discuss any questions you have with your health care provider.   Document Released: 03/24/2008 Document Revised: 12/29/2011 Document Reviewed: 05/29/2011 Elsevier Interactive Patient Education 2016 [place] Lymph Node Biopsy, Care After Refer to this sheet in the next few weeks. These instructions provide you with information on caring for yourself after your procedure. Your health care provider may also give you more specific instructions. Your treatment has been planned according to current medical practices, but problems sometimes occur. Call your health care provider if you have any problems or questions after your procedure. WHAT TO EXPECT AFTER THE PROCEDURE After your procedure, it is typical to have the following:   Your urine may be blue for the next 24 hours. This is normal.  It is caused by the dye used during the procedure.  Your skin at the injection site may be blue for up to 8 weeks.  You may feel numbness, tingling, or pain near your surgical incision.  You may have swelling or bruising near your incision. HOME CARE INSTRUCTIONS  Avoid vigorous exercise. Ask your health care provider when you can return to your normal activities.  You may shower 24 hours after your procedure. It is okay to get your incision wet. Pat the area dry with a clean towel. Do not rub the incision because this may cause bleeding.  Women who are given a surgical bra should wear it for the next 48 hours. The bra may be removed to shower.  There are many different ways to close and cover an incision, including stitches, skin glue, and adhesive strips. Follow your health care provider's instructions on:  Incision care.  Bandage (dressing) changes and removal.  Incision closure removal.  Take medicines only as directed by your health care provider.  You may resume your regular diet.  Do not have your blood pressure taken in the arm on the side of the biopsy until your health care provider says it is okay.  Keep all follow-up visits as directed by your health care provider. This is important. SEEK MEDICAL CARE IF:  Your pain medicine is not helping.  You have nausea and vomiting.  You have any new swelling, bruising, or redness.  You have  chills or a fever. SEEK IMMEDIATE MEDICAL CARE IF:   You have pain that is getting worse, and your medicine is not helping.  You have redness, swelling, or tenderness that is getting worse.  You have bleeding or drainage from your incision site.  You have vomiting that will not stop.  You have chest pain or trouble breathing.   This information is not intended to replace advice given to you by your health care provider. Make sure you discuss any questions you have with your health care provider.   Document Released: 05/20/2004  Document Revised: 10/27/2014 Document Reviewed: 11/25/2013 Elsevier Interactive Patient Education [employer].

## 2015-09-24 NOTE — ED Provider Notes (Signed)
CSN: [id number]     Arrival date & time 09/24/15  [id number] History   First MD Initiated Contact with Patient 09/24/15 [phone]    Nurses notes were reviewed. Chief Complaint  Patient presents with  . Arm Pain   patient is here because she's had mastectomy on the right breat for precancerous lesions followed with a preservation/prophylactic mastectomy on the left. She's felt a lymph node on the left side is some tenderness she's had a lymph node biopsy in October that was unsuccessful. Since then she's been here to be evaluated had ultrasound to evaluate the size lymph node she seen Dr. Jamal Collin car as well is also felt that the dose be watched but now she's having actual pain in the left arm or down arm and her surgeon in [place] suggested to her that she be checked for DVT since she's had a DVT before on the right side and had to be on Coumadin for 6 months was having some pain for the last several days and concern     (Consider location/radiation/quality/duration/timing/severity/associated sxs/prior Treatment) Patient is a 49 y.o. female presenting with arm pain. The history is provided by the patient. No language interpreter was used.  Arm Pain This is a new problem. The current episode started more than 2 days ago. The problem occurs constantly. The problem has not changed since onset.Pertinent negatives include no chest pain, no abdominal pain, no headaches and no shortness of breath. Nothing aggravates the symptoms. Nothing relieves the symptoms.    Past Medical History  Diagnosis Date  . History of blood clots   . 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 Complete mastectomy w/ sentinel node biopsy Bilateral 2010    Right breast DCIS  . Breast reconstruction Bilateral 2011    Dr Greta Doom in [place]   Family History  Problem Relation Age of Onset  . Adopted: Yes   Social History  Substance Use Topics  . Smoking status: Never Smoker   .  Smokeless tobacco: Never Used  . Alcohol Use: No   OB History    Gravida Para Term Preterm AB TAB SAB Ectopic Multiple Living   0 0 0 0 0 0 0 0 0 0       Obstetric Comments   1st Menstrual Cycle:  14      Review of Systems  Respiratory: Negative for apnea and shortness of breath.   Cardiovascular: Negative for chest pain.  Gastrointestinal: Negative for abdominal pain.  Genitourinary:       Bilateral mastectomy  Neurological: Negative for headaches.  All other systems reviewed and are negative.   Allergies  Review of patient's allergies indicates no known allergies.  Home Medications   Prior to Admission medications   Medication Sig Start Date End Date Taking? Authorizing Provider  b complex vitamins capsule Take by mouth. 07/19/10   Historical Provider, MD  ibuprofen (ADVIL,MOTRIN) 800 MG tablet Take 1 tablet (800 mg total) by mouth every 8 (eight) hours as needed for mild pain or moderate pain. 08/27/15   Olen Cordial, NP  Lactobacillus Rhamnosus, GG, (CULTURELLE IMMUNITY SUPPORT) CAPS Take by mouth. 07/19/10   Historical Provider, MD  magnesium 30 MG tablet Take by mouth.    Historical Provider, MD  Multiple Vitamins-Minerals (HAIR SKIN AND NAILS FORMULA PO) Take by mouth daily.    Historical Provider, MD  Omega-3 Fatty Acids (KP FISH OIL) 1200 MG CAPS Take  by mouth.    Historical Provider, MD   Meds Ordered and Administered this Visit  Medications - No data to display  BP 135/87 mmHg  Pulse 94  Temp(Src) 98.8 F (37.1 C) (Tympanic)  Resp 16  Ht 5\' 7"  (1.702 m)  Wt 174 lb (78.926 kg)  BMI 27.25 kg/m2  SpO2 97%  LMP 09/09/2015 (Exact Date) No data found.   Physical Exam  Constitutional: She is oriented to person, place, and time. She appears well-developed and well-nourished.  HENT:  Head: Normocephalic.  Eyes: Pupils are equal, round, and reactive to light.  Neck: Normal range of motion. Neck supple.  Pulmonary/Chest: Chest wall is not dull to percussion.  She exhibits no tenderness and no laceration.    Patient biopsy site along the chest wall but the pains tender she's having is in the axillary area along the left humerus. Both areas and axillary areas were examined because of patient's concern no clear-cut pathology was noted. Even where the lymph node on the left was reported to be cannot be physically detected by my examination.  Abdominal: Soft.  Musculoskeletal: Normal range of motion. She exhibits tenderness. She exhibits no edema.  Neurological: She is alert and oriented to person, place, and time. No cranial nerve deficit. Coordination normal.  Skin: Skin is warm and dry. No erythema.  Psychiatric: She has a normal mood and affect. Her behavior is normal.  Vitals reviewed.   ED Course  Procedures (including critical care time)  Labs Review Labs Reviewed - No data to display  Imaging Review US Venous Img Upper Uni Left  09/24/2015  CLINICAL DATA:  Left upper extremity pain for 1 month EXAM: LEFT UPPER EXTREMITY VENOUS DOPPLER ULTRASOUND TECHNIQUE: Gray-scale sonography with graded compression, as well as color Doppler and duplex ultrasound were performed to evaluate the upper extremity deep venous system from the level of the subclavian vein and including the jugular, axillary, basilic, radial, ulnar and upper cephalic vein. Spectral Doppler was utilized to evaluate flow at rest and with distal augmentation maneuvers. COMPARISON:  None. FINDINGS: Contralateral Subclavian Vein: Respiratory phasicity is normal and symmetric with the symptomatic side. No evidence of thrombus. Normal compressibility. Internal Jugular Vein: No evidence of thrombus. Normal compressibility, respiratory phasicity and response to augmentation. Subclavian Vein: No evidence of thrombus. Normal compressibility, respiratory phasicity and response to augmentation. Axillary Vein: No evidence of thrombus. Normal compressibility, respiratory phasicity and response to  augmentation. Cephalic Vein: No evidence of thrombus. Normal compressibility, respiratory phasicity and response to augmentation. Basilic Vein: No evidence of thrombus. Normal compressibility, respiratory phasicity and response to augmentation. Brachial Veins: No evidence of thrombus. Normal compressibility, respiratory phasicity and response to augmentation. Radial Veins: No evidence of thrombus. Normal compressibility, respiratory phasicity and response to augmentation. Ulnar Veins: No evidence of thrombus. Normal compressibility, respiratory phasicity and response to augmentation. Venous Reflux:  None visualized. Other Findings:  None visualized. IMPRESSION: No evidence of deep venous thrombosis. Electronically Signed   By: Rolm Baptise M.D.   On: 09/24/2015 12:02     Visual Acuity Review  Right Eye Distance:   Left Eye Distance:   Bilateral Distance:    Right Eye Near:   Left Eye Near:    Bilateral Near:         MDM  No diagnosis found.  Patient informed no signs of clot left area fine. At this point time will have follow-up with her surgeon in [place] at [facility].  Frederich Cha, MD 09/24/15  2234 

## 2015-09-24 NOTE — ED Notes (Signed)
Patient c/o pain in her left lower arm.  Patient reports that she had DVT in her left arm in 2011.  Patient reports for the past month she has had an enlarge lymph node in her left axillary.  Patient denies fevers.  Patient denies N/V.  Patient denies chest pain and SOB.

## 2015-10-08 ENCOUNTER — Encounter: Payer: Self-pay | Admitting: General Surgery

## 2015-10-08 ENCOUNTER — Ambulatory Visit (INDEPENDENT_AMBULATORY_CARE_PROVIDER_SITE_OTHER): Payer: No Typology Code available for payment source | Admitting: General Surgery

## 2015-10-08 ENCOUNTER — Other Ambulatory Visit: Payer: No Typology Code available for payment source

## 2015-10-08 VITALS — BP 132/76 | HR 78 | Resp 12 | Ht 67.0 in | Wt 174.0 lb

## 2015-10-08 DIAGNOSIS — M799 Soft tissue disorder, unspecified: Secondary | ICD-10-CM

## 2015-10-08 DIAGNOSIS — R590 Localized enlarged lymph nodes: Secondary | ICD-10-CM | POA: Diagnosis not present

## 2015-10-08 DIAGNOSIS — M7989 Other specified soft tissue disorders: Secondary | ICD-10-CM

## 2015-10-08 NOTE — Patient Instructions (Addendum)
Patient to return in February 2017 for her follow up exam and abdomen ultrasound

## 2015-10-08 NOTE — Progress Notes (Signed)
Patient ID: Beth Branch, female   DOB: Feb 24, 1966, 49 y.o.   MRN: [id number]  Chief Complaint  Patient presents with  . Follow-up    left arm pain    HPI Beth Branch is a 49 y.o. female here today presenting with some pain and palpable mass in axillae. She states she can feel some nodules in the left axillary area- this has been going on for about two months now. She states in the last month her right axillary area is starting to have some pain. She ws seen few mos ago and exam was unremarkable. Also she has noted a lump in abdominal wall on right side. I have reviewed the history of present illness with the patient. HPI  Past Medical History  Diagnosis Date  . History of blood clots   . 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 Complete mastectomy w/ sentinel node biopsy Bilateral 2010    Right breast DCIS  . Breast reconstruction Bilateral 2011    Dr Greta Doom in [place]    Family History  Problem Relation Age of Onset  . Adopted: Yes    Social History Social History  Substance Use Topics  . Smoking status: Never Smoker   . Smokeless tobacco: Never Used  . Alcohol Use: No    No Known Allergies  Current Outpatient Prescriptions  Medication Sig Dispense Refill  . b complex vitamins capsule Take by mouth.    Marland Kitchen ibuprofen (ADVIL,MOTRIN) 800 MG tablet Take 1 tablet (800 mg total) by mouth every 8 (eight) hours as needed for mild pain or moderate pain. 30 tablet 0  . Lactobacillus Rhamnosus, GG, (CULTURELLE IMMUNITY SUPPORT) CAPS Take by mouth.    . magnesium 30 MG tablet Take by mouth.    . Multiple Vitamins-Minerals (HAIR SKIN AND NAILS FORMULA PO) Take by mouth daily.    . Omega-3 Fatty Acids (KP FISH OIL) 1200 MG CAPS Take by mouth.     No current facility-administered medications for this visit.    Review of Systems Review of Systems  Blood pressure 132/76, pulse 78, resp. rate 12, height 5\' 7"   (1.702 m), weight 174 lb (78.926 kg), last menstrual period 09/09/2015.  Physical Exam Physical Exam  Constitutional: She is oriented to person, place, and time. She appears well-developed and well-nourished.  Pulmonary/Chest:  bil mastectomy with reconstruction well healed.  Abdominal: Soft.    Lymphadenopathy:    She has no cervical adenopathy.    She has no axillary adenopathy.  No apparent palpable nodes in either axilla. Pt points to the anterior fold where she thought she has felt some nodes. Again no palpable finbdings there.  Neurological: She is alert and oriented to person, place, and time.  Skin: Skin is warm and dry.    Data Reviewed Prior notes. Abd wall mass was evaluated with [place] and shows a 1.5cm hypoechoic mass overlying the fascia. It has some slightly thickened wall and is mildly lobulated.    Assessment    Axillary lymphadenopathy, left - likely transient and benign as no nodes are palpable Hx of right breast DCIS post b/l mastectomy with reconstruction.   Abd wall mass-uncertain nature. Can be followed.   Plan    Patient to return in February 2017 for her follow up exam and axillary and abdomen ultrasound    PCP:  Provider Not In System  This information has been scribed by Janett Billow  Qualls CMA. Beth Branch G 10/08/2015, 1:21 PM

## 2015-12-05 ENCOUNTER — Other Ambulatory Visit: Payer: Self-pay

## 2015-12-05 ENCOUNTER — Ambulatory Visit (INDEPENDENT_AMBULATORY_CARE_PROVIDER_SITE_OTHER): Payer: BLUE CROSS/BLUE SHIELD | Admitting: General Surgery

## 2015-12-05 ENCOUNTER — Encounter: Payer: Self-pay | Admitting: General Surgery

## 2015-12-05 VITALS — BP 118/68 | HR 78 | Resp 12 | Ht 67.0 in | Wt 178.0 lb

## 2015-12-05 DIAGNOSIS — R1900 Intra-abdominal and pelvic swelling, mass and lump, unspecified site: Secondary | ICD-10-CM

## 2015-12-05 DIAGNOSIS — R590 Localized enlarged lymph nodes: Secondary | ICD-10-CM

## 2015-12-05 NOTE — Patient Instructions (Addendum)
Patient to return for excision abdominal wall mass at [facility].  This patient's surgery has been scheduled for 12-18-15.

## 2015-12-05 NOTE — Progress Notes (Signed)
Patient ID: Beth Branch, female   DOB: Dec 20, 1965, 50 y.o.   MRN: [id number]  Chief Complaint  Patient presents with  . Follow-up    left axilla pain    HPI Beth Branch is a 50 y.o. female here today for her three month follow up left axilla pain and still feel a mass in her abdomen. Patient had a right axilla ultrasound out of town and   a biopsy that was negative.  I have reviewed the history of present illness with the patient. HPI  Past Medical History  Diagnosis Date  . History of blood clots   . 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 Complete mastectomy w/ sentinel node biopsy Bilateral 2010    Right breast DCIS  . Breast reconstruction Bilateral 2011    Dr Greta Doom in [place]    Family History  Problem Relation Age of Onset  . Adopted: Yes    Social History Social History  Substance Use Topics  . Smoking status: Never Smoker   . Smokeless tobacco: Never Used  . Alcohol Use: No    No Known Allergies  Current Outpatient Prescriptions  Medication Sig Dispense Refill  . b complex vitamins capsule Take by mouth.    Marland Kitchen ibuprofen (ADVIL,MOTRIN) 800 MG tablet Take 1 tablet (800 mg total) by mouth every 8 (eight) hours as needed for mild pain or moderate pain. 30 tablet 0  . Lactobacillus Rhamnosus, GG, (CULTURELLE IMMUNITY SUPPORT) CAPS Take by mouth.    . magnesium 30 MG tablet Take by mouth.    . Multiple Vitamins-Minerals (HAIR SKIN AND NAILS FORMULA PO) Take by mouth daily.    . Omega-3 Fatty Acids (KP FISH OIL) 1200 MG CAPS Take by mouth.     No current facility-administered medications for this visit.    Review of Systems Review of Systems  Constitutional: Negative.   Respiratory: Negative.   Cardiovascular: Negative.     Blood pressure 118/68, pulse 78, resp. rate 12, height 5\' 7"  (1.702 m), weight 178 lb (80.74 kg).  Physical Exam Physical Exam  Constitutional: She is oriented  to person, place, and time. She appears well-developed and well-nourished.  Eyes: Conjunctivae are normal. No scleral icterus.  Neck: Neck supple.  Cardiovascular: Normal rate, regular rhythm and normal heart sounds.   Pulmonary/Chest: Effort normal and breath sounds normal.  Abdominal: Soft. Normal appearance and bowel sounds are normal. There is no tenderness.    Lymphadenopathy:    She has no cervical adenopathy.  Neurological: She is alert and oriented to person, place, and time.  Skin: Skin is warm and dry.    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   Abd wall mass ultrasound performed - Again a hypoechoic mass lying on the fascia is seen, still at 1.51cm max size. It's margin is thick Plan    Patient Advised to have the mass excised-  Feels like the mass arises from fascia. She is agreeable. This patient's surgery has been scheduled for 12-18-15.   PCP:  Eber Jones E This information has been scribed by Gaspar Cola CMA.   SANKAR,SEEPLAPUTHUR G 12/05/2015, 10:56 AM

## 2015-12-10 ENCOUNTER — Telehealth: Payer: Self-pay | Admitting: *Deleted

## 2015-12-10 NOTE — Telephone Encounter (Signed)
Patient called wanting to reschedule surgery from 12-18-15 to 12-26-15 at [facility].   Leah in the O.R. Has been notified of date change.

## 2015-12-12 ENCOUNTER — Other Ambulatory Visit: Payer: No Typology Code available for payment source

## 2015-12-12 ENCOUNTER — Encounter: Payer: Self-pay | Admitting: *Deleted

## 2015-12-13 ENCOUNTER — Encounter: Payer: Self-pay | Admitting: *Deleted

## 2015-12-13 NOTE — Patient Instructions (Signed)
  Your procedure is scheduled on: 12-26-15 Report to [facility] To find out your arrival time please call [phone] between 1PM - 3PM on 12-25-15  Remember: Instructions that are not followed completely may result in serious medical risk, up to and including death, or upon the discretion of your surgeon and anesthesiologist your surgery may need to be rescheduled.    _X___ 1. Do not eat food or drink liquids after midnight. No gum chewing or hard candies.     _X___ 2. No Alcohol for 24 hours before or after surgery.   ____ 3. Bring all medications with you on the day of surgery if instructed.    ____ 4. Notify your doctor if there is any change in your medical condition     (cold, fever, infections).     Do not wear jewelry, make-up, hairpins, clips or nail polish.  Do not wear lotions, powders, or perfumes. You may wear deodorant.  Do not shave 48 hours prior to surgery. Men may shave face and neck.  Do not bring valuables to the hospital.    [facility] is not responsible for any belongings or valuables.               Contacts, dentures or bridgework may not be worn into surgery.  Leave your suitcase in the car. After surgery it may be brought to your room.  For patients admitted to the hospital, discharge time is determined by your treatment team.   Patients discharged the day of surgery will not be allowed to drive home.   Please read over the following fact sheets that you were given:      ____ Take these medicines the morning of surgery with A SIP OF WATER:    1. NONE  2.   3.   4.  5.  6.  ____ Fleet Enema (as directed)   ____ Use CHG Soap as directed  ____ Use inhalers on the day of surgery  ____ Stop metformin 2 days prior to surgery    ____ Take 1/2 of usual insulin dose the night before surgery and none on the morning of surgery.   ____ Stop Coumadin/Plavix/aspirin-N/A  ____ Stop Anti-inflammatories-NO NSAIDS OR ASA PRODUCTS  WITHIN 7 DAYS OF SURGERY-TYLENOL OK TO TAKE   _X___ Stop supplements until after surgery-STOP ALL SUPPLEMENTS 7 DAYS PRIOR TO SURGERY   ____ Bring C-Pap to the hospital.

## 2015-12-26 ENCOUNTER — Encounter
Admission: RE | Disposition: A | Discharge status: Home / Self Care | Payer: Self-pay | Source: Ambulatory Visit | Attending: General Surgery

## 2015-12-26 ENCOUNTER — Encounter: Payer: Self-pay | Admitting: *Deleted

## 2015-12-26 ENCOUNTER — Ambulatory Visit
Admission: RE | Admit: 2015-12-26 | Discharge: 2015-12-26 | Disposition: A | Discharge status: Home / Self Care | Payer: BLUE CROSS/BLUE SHIELD | Source: Ambulatory Visit | Attending: General Surgery | Admitting: General Surgery

## 2015-12-26 ENCOUNTER — Ambulatory Visit: Payer: BLUE CROSS/BLUE SHIELD | Admitting: Certified Registered Nurse Anesthetist

## 2015-12-26 DIAGNOSIS — R1900 Intra-abdominal and pelvic swelling, mass and lump, unspecified site: Secondary | ICD-10-CM | POA: Diagnosis present

## 2015-12-26 DIAGNOSIS — Z9013 Acquired absence of bilateral breasts and nipples: Secondary | ICD-10-CM | POA: Diagnosis not present

## 2015-12-26 DIAGNOSIS — Z9889 Other specified postprocedural states: Secondary | ICD-10-CM | POA: Diagnosis not present

## 2015-12-26 DIAGNOSIS — Z86718 Personal history of other venous thrombosis and embolism: Secondary | ICD-10-CM | POA: Diagnosis not present

## 2015-12-26 DIAGNOSIS — Z79899 Other long term (current) drug therapy: Secondary | ICD-10-CM | POA: Diagnosis not present

## 2015-12-26 DIAGNOSIS — Z853 Personal history of malignant neoplasm of breast: Secondary | ICD-10-CM | POA: Insufficient documentation

## 2015-12-26 DIAGNOSIS — D235 Other benign neoplasm of skin of trunk: Secondary | ICD-10-CM | POA: Diagnosis not present

## 2015-12-26 DIAGNOSIS — D1803 Hemangioma of intra-abdominal structures: Secondary | ICD-10-CM | POA: Insufficient documentation

## 2015-12-26 HISTORY — DX: Encounter for adoption services: Z02.82

## 2015-12-26 HISTORY — PX: EXCISION MASS ABDOMINAL: SHX6701

## 2015-12-26 HISTORY — DX: Other specified health status: Z78.9

## 2015-12-26 LAB — POCT PREGNANCY, URINE: PREG TEST UR: NEGATIVE

## 2015-12-26 SURGERY — EXCISION, MASS, TORSO
Anesthesia: General | Wound class: Clean

## 2015-12-26 MED ORDER — BUPIVACAINE HCL (PF) 0.5 % IJ SOLN
INTRAMUSCULAR | Status: AC
  Filled 2015-12-26: qty 30

## 2015-12-26 MED ORDER — FENTANYL CITRATE (PF) 100 MCG/2ML IJ SOLN
25.0000 ug | INTRAMUSCULAR | Status: DC | PRN
  Administered 2015-12-26 (×4): 25 ug via INTRAVENOUS

## 2015-12-26 MED ORDER — BUPIVACAINE HCL (PF) 0.5 % IJ SOLN
INTRAMUSCULAR | Status: DC | PRN
  Administered 2015-12-26: 8 mL
  Administered 2015-12-26: 2 mL

## 2015-12-26 MED ORDER — FAMOTIDINE 20 MG PO TABS
20.0000 mg | ORAL_TABLET | Freq: Once | ORAL | Status: AC
  Administered 2015-12-26: 20 mg via ORAL

## 2015-12-26 MED ORDER — CHLORHEXIDINE GLUCONATE 4 % EX LIQD: 1.0000 "application " | Freq: Once | CUTANEOUS | Status: DC

## 2015-12-26 MED ORDER — MIDAZOLAM HCL 2 MG/2ML IJ SOLN
INTRAMUSCULAR | Status: DC | PRN
  Administered 2015-12-26: 2 mg via INTRAVENOUS

## 2015-12-26 MED ORDER — TRAMADOL HCL 50 MG PO TABS: 50.0000 mg | ORAL_TABLET | Freq: Four times a day (QID) | ORAL | Status: DC | PRN

## 2015-12-26 MED ORDER — FENTANYL CITRATE (PF) 100 MCG/2ML IJ SOLN
INTRAMUSCULAR | Status: DC | PRN
  Administered 2015-12-26: 100 ug via INTRAVENOUS

## 2015-12-26 MED ORDER — LACTATED RINGERS IV SOLN
INTRAVENOUS | Status: DC
  Administered 2015-12-26 (×2): via INTRAVENOUS

## 2015-12-26 MED ORDER — FENTANYL CITRATE (PF) 100 MCG/2ML IJ SOLN
INTRAMUSCULAR | Status: AC
  Filled 2015-12-26: qty 2

## 2015-12-26 MED ORDER — LIDOCAINE HCL (CARDIAC) 20 MG/ML IV SOLN
INTRAVENOUS | Status: DC | PRN
  Administered 2015-12-26: 100 mg via INTRAVENOUS

## 2015-12-26 MED ORDER — DEXAMETHASONE SODIUM PHOSPHATE 10 MG/ML IJ SOLN
INTRAMUSCULAR | Status: DC | PRN
Start: 2015-12-26 — End: 2015-12-26
  Administered 2015-12-26: 10 mg via INTRAVENOUS

## 2015-12-26 MED ORDER — PROPOFOL 10 MG/ML IV BOLUS
INTRAVENOUS | Status: DC | PRN
  Administered 2015-12-26: 170 mg via INTRAVENOUS

## 2015-12-26 MED ORDER — LIDOCAINE HCL (PF) 1 % IJ SOLN
INTRAMUSCULAR | Status: AC
  Filled 2015-12-26: qty 30

## 2015-12-26 MED ORDER — FAMOTIDINE 20 MG PO TABS
ORAL_TABLET | ORAL | Status: AC
  Administered 2015-12-26: 20 mg via ORAL
  Filled 2015-12-26: qty 1

## 2015-12-26 MED ORDER — ONDANSETRON HCL 4 MG/2ML IJ SOLN
INTRAMUSCULAR | Status: DC | PRN
  Administered 2015-12-26: 4 mg via INTRAVENOUS

## 2015-12-26 MED ORDER — ONDANSETRON HCL 4 MG/2ML IJ SOLN: 4.0000 mg | Freq: Once | INTRAMUSCULAR | Status: DC | PRN

## 2015-12-26 SURGICAL SUPPLY — 19 items
BLADE SURG 15 STRL LF DISP TIS (BLADE) ×1 IMPLANT
BLADE SURG 15 STRL SS (BLADE) ×2
CANISTER SUCT 1200ML W/VALVE (MISCELLANEOUS) ×2 IMPLANT
CHLORAPREP W/TINT 26ML (MISCELLANEOUS) ×2 IMPLANT
DRAPE LAPAROTOMY T 102X78X121 (DRAPES) ×2 IMPLANT
ELECT REM PT RETURN 9FT ADLT (ELECTROSURGICAL) ×2
ELECTRODE REM PT RTRN 9FT ADLT (ELECTROSURGICAL) ×1 IMPLANT
GLOVE BIO SURGEON STRL SZ7.5 (GLOVE) ×2 IMPLANT
GLOVE INDICATOR 8.0 STRL GRN (GLOVE) ×3 IMPLANT
GOWN STRL REUS W/ TWL LRG LVL3 (GOWN DISPOSABLE) ×3 IMPLANT
GOWN STRL REUS W/TWL LRG LVL3 (GOWN DISPOSABLE) ×6
KIT RM TURNOVER STRD PROC AR (KITS) ×2 IMPLANT
LIQUID BAND (GAUZE/BANDAGES/DRESSINGS) ×2 IMPLANT
NDL SAFETY 22GX1.5 (NEEDLE) ×2 IMPLANT
PACK BASIN MINOR ARMC (MISCELLANEOUS) ×2 IMPLANT
SUT MNCRL AB 4-0 PS2 18 (SUTURE) ×2 IMPLANT
SUT VIC AB 2-0 SH 27 (SUTURE) ×2
SUT VIC AB 2-0 SH 27XBRD (SUTURE) ×1 IMPLANT
SYRINGE 10CC LL (SYRINGE) ×2 IMPLANT

## 2015-12-26 NOTE — Transfer of Care (Signed)
Immediate Anesthesia Transfer of Care Note  Patient: Beth Branch  Procedure(s) Performed: Procedure(s): EXCISION MASS ABDOMINAL (N/A)  Patient Location: PACU  Anesthesia Type:General  Level of Consciousness: sedated  Airway & Oxygen Therapy: Patient Spontanous Breathing and Patient connected to face mask oxygen  Post-op Assessment: Report given to RN and Post -op Vital signs reviewed and stable  Post vital signs: Reviewed and stable  Last Vitals:  Filed Vitals:   12/26/15 1051  BP: 133/85  Pulse: 93  Temp: 36.8 C  Resp: 16    Complications: No apparent anesthesia complications

## 2015-12-26 NOTE — Op Note (Signed)
Preop diagnosis: Abdominal wall mass  Post op diagnosis: Same  Operation: Excision of abdominal wall mass  Surgeon: S.G.Demarri Elie  Assistant:     Anesthesia: Gen.  Complications: None  EBL: Minimal  Drains: None  Description: Patient was put to sleep with an LMA and the abdominal wall was prepped and draped sterile field. Timeout was performed. The mass was located overlying the anterior sheath of the rectus to the right of the umbilicus and slightly superior. Ultrasound was used to identify the location of this and this was marked on the skin. A transverse skin incision was mapped out and 10 mL of 0.5% Marcaine instilled. Skin incision was made about the 3 cm in size and deepened through the subcutaneous tissue down to the deep layers. Bleeding was controlled with cautery. There was a smooth 1.5 cm sized firm mass overlying the anterior sheath of the rectus. This was circumferentially freed and removed close to the anterior surface of the rectus fascia. The mass did not penetrate the fascia. The excised mass was sent in formalin for pathologic evaluation. The deeper tissue was closed with 3-0 Vicryl. Skin closed with subcuticular 4-0 Monocryl. Liqui  ban was applied. Patient subsequently returned recovery room stable condition

## 2015-12-26 NOTE — Interval H&P Note (Signed)
History and Physical Interval Note:  12/26/2015 10:56 AM  Beth Branch  has presented today for surgery, with the diagnosis of abdominal wall mass  The various methods of treatment have been discussed with the patient and family. After consideration of risks, benefits and other options for treatment, the patient has consented to  Procedure(s): EXCISION MASS ABDOMINAL (N/A) as a surgical intervention .  The patient's history has been reviewed, patient examined, no change in status, stable for surgery.  I have reviewed the patient's chart and labs.  Questions were answered to the patient's satisfaction.     [name]

## 2015-12-26 NOTE — Discharge Instructions (Signed)

## 2015-12-26 NOTE — Anesthesia Preprocedure Evaluation (Signed)
Anesthesia Evaluation  Patient identified by MRN, date of birth, ID band Patient awake    Reviewed: Allergy & Precautions, NPO status , Patient's Chart, lab work & pertinent test results, reviewed documented beta blocker date and time   Airway Mallampati: II  TM Distance: >3 FB     Dental  (+) Chipped   Pulmonary           Cardiovascular      Neuro/Psych    GI/Hepatic   Endo/Other    Renal/GU      Musculoskeletal   Abdominal   Peds  Hematology   Anesthesia Other Findings   Reproductive/Obstetrics                             Anesthesia Physical Anesthesia Plan  ASA: II  Anesthesia Plan: General   Post-op Pain Management:    Induction: Intravenous  Airway Management Planned: Oral ETT and LMA  Additional Equipment:   Intra-op Plan:   Post-operative Plan:   Informed Consent: I have reviewed the patients History and Physical, chart, labs and discussed the procedure including the risks, benefits and alternatives for the proposed anesthesia with the patient or authorized representative who has indicated his/her understanding and acceptance.     Plan Discussed with: CRNA  Anesthesia Plan Comments:         Anesthesia Quick Evaluation

## 2015-12-26 NOTE — Anesthesia Postprocedure Evaluation (Signed)
Anesthesia Post Note  Patient: Beth Branch  Procedure(s) Performed: Procedure(s) (LRB): EXCISION MASS ABDOMINAL (N/A)  Patient location during evaluation: PACU Anesthesia Type: General Level of consciousness: awake and alert Pain management: pain level controlled Vital Signs Assessment: post-procedure vital signs reviewed and stable Respiratory status: spontaneous breathing, nonlabored ventilation, respiratory function stable and patient connected to nasal cannula oxygen Cardiovascular status: blood pressure returned to baseline and stable Postop Assessment: no signs of nausea or vomiting Anesthetic complications: no    Last Vitals:  Filed Vitals:   12/26/15 1051 12/26/15 1224  BP: 133/85   Pulse: 93 87  Temp: 36.8 C 36.3 C  Resp: 16 20    Last Pain:  Filed Vitals:   12/26/15 1256  PainSc: [place]

## 2015-12-26 NOTE — Anesthesia Procedure Notes (Signed)
Procedure Name: LMA Insertion Date/Time: 12/26/2015 12:11 PM Performed by: Nelda Marseille Pre-anesthesia Checklist: Patient identified, Patient being monitored, Timeout performed, Emergency Drugs available and Suction available Patient Re-evaluated:Patient Re-evaluated prior to inductionOxygen Delivery Method: Circle system utilized Preoxygenation: Pre-oxygenation with 100% oxygen Intubation Type: IV induction Ventilation: Mask ventilation without difficulty LMA: LMA inserted LMA Size: 3.5 Tube type: Oral Number of attempts: 1 Placement Confirmation: positive ETCO2 and breath sounds checked- equal and bilateral Tube secured with: Tape Dental Injury: Teeth and Oropharynx as per pre-operative assessment

## 2015-12-26 NOTE — H&P (View-Only) (Signed)
Patient ID: Beth Branch, female   DOB: Dec 30, 1965, 50 y.o.   MRN: [id number]  Chief Complaint  Patient presents with  . Follow-up    left axilla pain    HPI Beth Branch is a 50 y.o. female here today for her three month follow up left axilla pain and still feel a mass in her abdomen. Patient had a right axilla ultrasound out of town and   a biopsy that was negative.  I have reviewed the history of present illness with the patient. HPI  Past Medical History  Diagnosis Date  . History of blood clots   . 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 Complete mastectomy w/ sentinel node biopsy Bilateral 2010    Right breast DCIS  . Breast reconstruction Bilateral 2011    Dr Greta Doom in [place]    Family History  Problem Relation Age of Onset  . Adopted: Yes    Social History Social History  Substance Use Topics  . Smoking status: Never Smoker   . Smokeless tobacco: Never Used  . Alcohol Use: No    No Known Allergies  Current Outpatient Prescriptions  Medication Sig Dispense Refill  . b complex vitamins capsule Take by mouth.    Marland Kitchen ibuprofen (ADVIL,MOTRIN) 800 MG tablet Take 1 tablet (800 mg total) by mouth every 8 (eight) hours as needed for mild pain or moderate pain. 30 tablet 0  . Lactobacillus Rhamnosus, GG, (CULTURELLE IMMUNITY SUPPORT) CAPS Take by mouth.    . magnesium 30 MG tablet Take by mouth.    . Multiple Vitamins-Minerals (HAIR SKIN AND NAILS FORMULA PO) Take by mouth daily.    . Omega-3 Fatty Acids (KP FISH OIL) 1200 MG CAPS Take by mouth.     No current facility-administered medications for this visit.    Review of Systems Review of Systems  Constitutional: Negative.   Respiratory: Negative.   Cardiovascular: Negative.     Blood pressure 118/68, pulse 78, resp. rate 12, height 5\' 7"  (1.702 m), weight 178 lb (80.74 kg).  Physical Exam Physical Exam  Constitutional: She is oriented  to person, place, and time. She appears well-developed and well-nourished.  Eyes: Conjunctivae are normal. No scleral icterus.  Neck: Neck supple.  Cardiovascular: Normal rate, regular rhythm and normal heart sounds.   Pulmonary/Chest: Effort normal and breath sounds normal.  Abdominal: Soft. Normal appearance and bowel sounds are normal. There is no tenderness.    Lymphadenopathy:    She has no cervical adenopathy.  Neurological: She is alert and oriented to person, place, and time.  Skin: Skin is warm and dry.    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   Abd wall mass ultrasound performed - Again a hypoechoic mass lying on the fascia is seen, still at 1.51cm max size. It's margin is thick Plan    Patient Advised to have the mass excised-  Feels like the mass arises from fascia. She is agreeable. This patient's surgery has been scheduled for 12-18-15.   PCP:  Eber Jones E This information has been scribed by Gaspar Cola CMA.   Kortnie Stovall G 12/05/2015, 10:56 AM

## 2015-12-27 ENCOUNTER — Telehealth: Payer: Self-pay | Admitting: *Deleted

## 2015-12-27 LAB — SURGICAL PATHOLOGY

## 2015-12-27 NOTE — Telephone Encounter (Signed)
Notified patient as instructed, patient pleased. Discussed follow-up appointments, patient agrees  

## 2015-12-27 NOTE — Telephone Encounter (Signed)
-----   Message from Christene Lye, MD sent at 12/27/2015  3:57 PM EST ----- Please let pt pt know the pathology was normal- no cancer.

## 2015-12-28 DIAGNOSIS — D1800 Hemangioma unspecified site: Secondary | ICD-10-CM

## 2015-12-28 HISTORY — DX: Hemangioma unspecified site: D18.00

## 2016-01-02 ENCOUNTER — Encounter: Payer: Self-pay | Admitting: General Surgery

## 2016-01-02 ENCOUNTER — Ambulatory Visit (INDEPENDENT_AMBULATORY_CARE_PROVIDER_SITE_OTHER): Payer: BLUE CROSS/BLUE SHIELD | Admitting: General Surgery

## 2016-01-02 VITALS — BP 128/74 | HR 76 | Resp 12 | Ht 67.5 in | Wt 181.0 lb

## 2016-01-02 DIAGNOSIS — D1801 Hemangioma of skin and subcutaneous tissue: Secondary | ICD-10-CM

## 2016-01-02 NOTE — Patient Instructions (Signed)
The patient is aware to call back for any questions or concerns.  

## 2016-01-02 NOTE — Progress Notes (Signed)
Here today for postoperative visit, excision abdominal wall mass, CAVERNOUS HEMANGIOMA on 12-26-15. Minimal pain, bowels moving well. I have reviewed the history of present illness with the patient.  Incision well healed, No sign of infection or hematoma.  No restrictions. Follow up in 13mos for assessment of axillary nodes and DCIS follow up  PCP:  Eber Jones E This information has been scribed by Karie Fetch RNBC.

## 2016-01-10 ENCOUNTER — Encounter: Payer: Self-pay | Admitting: *Deleted

## 2016-01-10 ENCOUNTER — Telehealth: Payer: Self-pay | Admitting: *Deleted

## 2016-01-10 NOTE — Telephone Encounter (Signed)
Patient was returning your call, call her back on cell

## 2016-01-10 NOTE — Telephone Encounter (Signed)
She will come by and pick up her copy.

## 2016-01-10 NOTE — Telephone Encounter (Signed)
Copy of [facility] test results are here

## 2016-03-27 ENCOUNTER — Encounter: Payer: Self-pay | Admitting: *Deleted

## 2016-04-03 ENCOUNTER — Ambulatory Visit (INDEPENDENT_AMBULATORY_CARE_PROVIDER_SITE_OTHER): Payer: BLUE CROSS/BLUE SHIELD | Admitting: General Surgery

## 2016-04-03 ENCOUNTER — Encounter: Payer: Self-pay | Admitting: General Surgery

## 2016-04-03 VITALS — BP 126/72 | HR 76 | Resp 12 | Ht 67.5 in | Wt 178.0 lb

## 2016-04-03 DIAGNOSIS — D1801 Hemangioma of skin and subcutaneous tissue: Secondary | ICD-10-CM | POA: Diagnosis not present

## 2016-04-03 DIAGNOSIS — R590 Localized enlarged lymph nodes: Secondary | ICD-10-CM

## 2016-04-03 DIAGNOSIS — D0511 Intraductal carcinoma in situ of right breast: Secondary | ICD-10-CM

## 2016-04-03 NOTE — Patient Instructions (Addendum)
The patient is aware to call back for any questions or concerns. Return in one year for follow-up.

## 2016-04-03 NOTE — Progress Notes (Signed)
Patient ID: Beth Branch, female   DOB: April 08, 1966, 50 y.o.   MRN: [id number]  Chief Complaint  Patient presents with  . Routine Post Op    HPI Beth Branch is a 50 y.o. female.  Follow up in 3 mos for assessment of left axillary nodes and right breast DCIS follow up. She is s/p bil mastectomy with implants She is postoperative, excision abdominal wall mass, cavernous hemangioma on 12-26-15. Patient endorses mild tenderness on right upper abdominal scar, otherwise no complaints.    HPI  Past Medical History  Diagnosis Date  . History of blood clots 2011    LEFT ARM  . Breast cancer ([place]) 2010    right breast DCIS  . Adopted   . BRCA negative 04-05-10    Past Surgical History  Procedure Laterality Date  . Complete mastectomy w/ sentinel node biopsy Bilateral 2010    Right breast DCIS  . Breast reconstruction Bilateral 2011    Dr Greta Doom in [place]  . Cholecystectomy    . Knee arthroscopy Right   . Excision mass abdominal N/A 12/26/2015    EXCISION MASS ABDOMINAL; CAVERNOUS HEMANGIOMA Eyob Godlewski Robinette Haines, MD;  Location: [facility] ORS;  Service: General;  Laterality: N/A;    Family History  Problem Relation Age of Onset  . Adopted: Yes    Social History Social History  Substance Use Topics  . Smoking status: Never Smoker   . Smokeless tobacco: Never Used  . Alcohol Use: No    No Known Allergies  Current Outpatient Prescriptions  Medication Sig Dispense Refill  . b complex vitamins capsule Take by mouth.    . Coenzyme Q10 (CO Q 10 PO) Take 1 tablet by mouth daily.    Marland Kitchen ibuprofen (ADVIL,MOTRIN) 800 MG tablet Take 1 tablet (800 mg total) by mouth every 8 (eight) hours as needed for mild pain or moderate pain. 30 tablet 0  . KRILL OIL PO Take 1 tablet by mouth daily.    . Lactobacillus Rhamnosus, GG, (CULTURELLE IMMUNITY SUPPORT) CAPS Take by mouth.    . magnesium 30 MG tablet Take by mouth.    . Methylsulfonylmethane (MSM PO) Take 1 tablet by mouth daily.    . Multiple  Vitamins-Minerals (HAIR SKIN AND NAILS FORMULA PO) Take by mouth daily.    . Omega-3 Fatty Acids (KP FISH OIL) 1200 MG CAPS Take by mouth.     No current facility-administered medications for this visit.    Review of Systems Review of Systems  Constitutional: Negative.   Respiratory: Negative.   Cardiovascular: Negative.     Blood pressure 126/72, pulse 76, resp. rate 12, height 5' 7.5" (1.715 m), weight 178 lb (80.74 kg), last menstrual period 03/24/2016.  Physical Exam Physical Exam  Constitutional: She is oriented to person, place, and time. She appears well-developed and well-nourished.  Eyes: Conjunctivae are normal. No scleral icterus.  Neck: Neck supple.  Cardiovascular: Normal rate, regular rhythm and normal heart sounds.   Pulmonary/Chest: Effort normal and breath sounds normal. Right breast exhibits no inverted nipple, no mass, no nipple discharge, no skin change and no tenderness. Left breast exhibits no inverted nipple, no mass, no nipple discharge, no skin change and no tenderness.  Abdominal: Soft. Normal appearance and bowel sounds are normal. There is no hepatomegaly. There is no tenderness. No hernia.    Lymphadenopathy:    She has no cervical adenopathy.    She has no axillary adenopathy.  Neurological: She is alert and oriented to person,  place, and time.  Skin: Skin is warm and dry.  Psychiatric: Her behavior is normal.    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  S/P excision abdominal wall mass, cavernous hemangioma.    Plan    Follow up in one year. Call clinic regarding any concerns. Patient agreeable to plan.    PCP:  Eber Jones E This information has been scribed by Karie Fetch RN, BSN,BC.    Cattleya Dobratz G 04/03/2016, 1:44 PM

## 2016-06-05 ENCOUNTER — Ambulatory Visit
Admission: EM | Admit: 2016-06-05 | Discharge: 2016-06-05 | Disposition: A | Discharge status: Home / Self Care | Payer: BLUE CROSS/BLUE SHIELD

## 2016-06-05 DIAGNOSIS — M62830 Muscle spasm of back: Secondary | ICD-10-CM | POA: Diagnosis not present

## 2016-06-05 DIAGNOSIS — M533 Sacrococcygeal disorders, not elsewhere classified: Secondary | ICD-10-CM

## 2016-06-05 DIAGNOSIS — M5441 Lumbago with sciatica, right side: Secondary | ICD-10-CM

## 2016-06-05 MED ORDER — ORPHENADRINE CITRATE ER 100 MG PO TB12: 100.0000 mg | ORAL_TABLET | Freq: Two times a day (BID) | ORAL | 0 refills | Status: DC

## 2016-06-05 MED ORDER — PREDNISONE 10 MG (21) PO TBPK: ORAL_TABLET | ORAL | 0 refills | Status: DC

## 2016-06-05 MED ORDER — MELOXICAM 15 MG PO TABS: 15.0000 mg | ORAL_TABLET | Freq: Every day | ORAL | 0 refills | Status: DC

## 2016-06-05 MED ORDER — OXYCODONE-ACETAMINOPHEN 5-325 MG PO TABS: 1.0000 | ORAL_TABLET | Freq: Three times a day (TID) | ORAL | 0 refills | Status: DC | PRN

## 2016-06-05 NOTE — ED Triage Notes (Signed)
Patient complains of low back pain that started one month ago. Patient states that she went to the gym last week and was doing leg presses and felt a pull in her back, patient states that pain worsened then. Patient states that she is having pain greater on left side more than right. Patient states that pain radiates down left leg and she states that getting up is worsening.

## 2016-06-05 NOTE — ED Provider Notes (Signed)
[facility]    CSN: [id number] Arrival date & time: 06/05/16  2060  First Provider Contact:  First MD Initiated Contact with Patient 06/05/16 [id number]        History   Chief Complaint Chief Complaint  Patient presents with  . Back Pain    HPI Beth Branch is a 50 y.o. female.   Patient is a 50 year old black female who is complaining of back pain. The back pain started basically about a week ago. She had not been the gym for a while and she started exercising. Her trainer had her using some weights with her legs. She was bench pressing to weights with her legs she felt some discomfort but when on. Initial dose of the low 20s in the back with a twinge of back is progressively gotten worse. She was traveling over the weekend and the pain has progressed the point where she is having difficulty walking. She states at night she can't rest and that she is up all night because the pain and discomfort. She denies any other trauma to her back. No real history of back problems before in the past. She has has a history of breast cancer and had evaluation of lymph nodes that have recur. History of blood clots. She does not smoke and she is adopted   The history is provided by the patient. No language interpreter was used.  Back Pain  Location:  Sacro-iliac joint and gluteal region Quality:  Aching, shooting and stabbing Radiates to:  R thigh and R knee Pain severity:  Severe Pain is:  Worse during the night Onset quality:  Gradual Duration:  7 days Timing:  Intermittent Progression:  Worsening Chronicity:  New Context: physical stress and twisting   Relieved by:  Nothing Worsened by:  Nothing Ineffective treatments:  None tried Associated symptoms: leg pain and weakness   Risk factors: no lack of exercise     Past Medical History:  Diagnosis Date  . Adopted   . BRCA negative 04-05-10  . Breast cancer ([place]) 2010   right breast DCIS  . History of blood clots 2011   LEFT  ARM    Patient Active Problem List   Diagnosis Date Noted  . 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 History of blood clots   . Breast cancer [facility])     Past Surgical History:  Procedure Laterality Date  . BREAST RECONSTRUCTION Bilateral 2011   Dr Greta Doom in [place]    . COMPLETE MASTECTOMY W/ SENTINEL NODE BIOPSY Bilateral 2010   Right breast DCIS  . EXCISION MASS ABDOMINAL N/A 12/26/2015   EXCISION MASS ABDOMINAL; CAVERNOUS HEMANGIOMA Seeplaputhur Robinette Haines, MD;  Location: [facility] ORS;  Service: General;  Laterality: N/A;  . KNEE ARTHROSCOPY Right     OB History    Gravida Para Term Preterm AB Living   0 0 0 0 0 0   SAB TAB Ectopic Multiple Live Births   0 0 0 0        Obstetric Comments   1st Menstrual Cycle:  14        Home Medications    Prior to Admission medications   Medication Sig Start Date End Date Taking? Authorizing Provider  b complex vitamins capsule Take by mouth. 07/19/10  Yes Historical Provider, MD  Coenzyme Q10 (CO Q 10 PO) Take 1 tablet by mouth daily.   Yes Historical Provider, MD  doxycycline (DORYX) 100 MG EC  tablet Take 100 mg by mouth 2 (two) times daily.   Yes Historical Provider, MD  ibuprofen (ADVIL,MOTRIN) 800 MG tablet Take 1 tablet (800 mg total) by mouth every 8 (eight) hours as needed for mild pain or moderate pain. 08/27/15  Yes Tina A Betancourt, NP  KRILL OIL PO Take 1 tablet by mouth daily.   Yes Historical Provider, MD  Lactobacillus Rhamnosus, GG, (CULTURELLE IMMUNITY SUPPORT) CAPS Take by mouth. 07/19/10  Yes Historical Provider, MD  magnesium 30 MG tablet Take by mouth.   Yes Historical Provider, MD  Methylsulfonylmethane (MSM PO) Take 1 tablet by mouth daily.   Yes Historical Provider, MD  Multiple Vitamins-Minerals (HAIR SKIN AND NAILS FORMULA PO) Take by mouth daily.   Yes Historical Provider, MD  Omega-3 Fatty Acids (KP FISH OIL) 1200 MG CAPS Take by mouth.   Yes Historical Provider, MD  meloxicam  (MOBIC) 15 MG tablet Take 1 tablet (15 mg total) by mouth daily. 06/05/16   Frederich Cha, MD  orphenadrine (NORFLEX) 100 MG tablet Take 1 tablet (100 mg total) by mouth 2 (two) times daily. 06/05/16   Frederich Cha, MD  oxyCODONE-acetaminophen (PERCOCET/ROXICET) 5-325 MG tablet Take 1 tablet by mouth every 8 (eight) hours as needed for severe pain. 06/05/16   Frederich Cha, MD  predniSONE (STERAPRED UNI-PAK 21 TAB) 10 MG (21) TBPK tablet Sig 6 tablet day 1, 5 tablets day 2, 4 tablets day 3,,3tablets day 4, 2 tablets day 5, 1 tablet day 6 take all tablets orally 06/05/16   Frederich Cha, MD    Family History Family History  Problem Relation Age of Onset  . Adopted: Yes    Social History Social History  Substance Use Topics  . Smoking status: Never Smoker  . Smokeless tobacco: Never Used  . Alcohol use No     Allergies   Review of patient's allergies indicates no known allergies.   Review of Systems Review of Systems  Musculoskeletal: Positive for back pain.  Neurological: Positive for weakness.  All other systems reviewed and are negative.    Physical Exam Triage Vital Signs ED Triage Vitals  Enc Vitals Group     BP 06/05/16 0835 (!) 146/92     Pulse Rate 06/05/16 0835 87     Resp 06/05/16 0835 17     Temp 06/05/16 0835 97.5 F (36.4 C)     Temp Source 06/05/16 0835 Tympanic     SpO2 06/05/16 0835 100 %     Weight 06/05/16 0835 176 lb (79.8 kg)     Height 06/05/16 0835 '5\' 7"'  (1.702 m)     Head Circumference --      Peak Flow --      Pain Score 06/05/16 0838 9     Pain Loc --      Pain Edu? --      Excl. in [place]? --    No data found.   Updated Vital Signs BP (!) 146/92 (BP Location: Left Arm)   Pulse 87   Temp 97.5 F (36.4 C) (Tympanic)   Resp 17   Ht '5\' 7"'  (1.702 m)   Wt 176 lb (79.8 kg)   LMP 06/04/2016   SpO2 100%   BMI 27.57 kg/m   Visual Acuity Right Eye Distance:   Left Eye Distance:   Bilateral Distance:    Right Eye Near:   Left Eye Near:      Bilateral Near:     Physical Exam  Constitutional: She is oriented  to person, place, and time. She appears well-developed and well-nourished.  HENT:  Head: Normocephalic and atraumatic.  Eyes: Pupils are equal, round, and reactive to light.  Neck: Normal range of motion. Neck supple.  Pulmonary/Chest: Effort normal.  Musculoskeletal: Normal range of motion. She exhibits tenderness. She exhibits no deformity.       Back:  Most tenderness pains over the right iliac sacral area consistent with a sacroiliitis. Reflexes were equal and symmetrical muscle tone was also good and there is no tenderness over the lumbar spine  Neurological: She is alert and oriented to person, place, and time. She has normal reflexes.  Skin: Skin is warm.  Psychiatric: She has a normal mood and affect. Her behavior is normal.  Vitals reviewed.    UC Treatments / Results  Labs (all labs ordered are listed, but only abnormal results are displayed) Labs Reviewed - No data to display  EKG  EKG Interpretation None       Radiology No results found.  Procedures Procedures (including critical care time)  Medications Ordered in UC Medications - No data to display   Initial Impression / Assessment and Plan / UC Course  I have reviewed the triage vital signs and the nursing notes.  Pertinent labs & imaging results that were available during my care of the patient were reviewed by me and considered in my medical decision making (see chart for details).  Clinical Course    Patient will be treated with short course of prednisone Mobic 15 mg and Norflex muscle relaxer. I will also place her on a short course of Percocet mainly just take at night to help her sleep since this spring for sleep. She was looked up at the [facility] drug prescribing site and has not had any narcotics for over 6 months. We'll also recommend possible chiropractic treatment if she is not better in 1-2 weeks and she can restart  her gym program in 2 weeks but be careful with the weights and how much she tries left at a time. Work for today and tomorrow as well.  Final Clinical Impressions(s) / UC Diagnoses   Final diagnoses:  Sacroiliac joint pain  Muscle spasm of back  Right-sided low back pain with right-sided sciatica    New Prescriptions Discharge Medication List as of 06/05/2016  9:09 AM    START taking these medications   Details  meloxicam (MOBIC) 15 MG tablet Take 1 tablet (15 mg total) by mouth daily., Starting Thu 06/05/2016, Normal    orphenadrine (NORFLEX) 100 MG tablet Take 1 tablet (100 mg total) by mouth 2 (two) times daily., Starting Thu 06/05/2016, Normal    oxyCODONE-acetaminophen (PERCOCET/ROXICET) 5-325 MG tablet Take 1 tablet by mouth every 8 (eight) hours as needed for severe pain., Starting Thu 06/05/2016, Normal    predniSONE (STERAPRED UNI-PAK 21 TAB) 10 MG (21) TBPK tablet Sig 6 tablet day 1, 5 tablets day 2, 4 tablets day 3,,3tablets day 4, 2 tablets day 5, 1 tablet day 6 take all tablets orally, Normal         Frederich Cha, MD 06/05/16 1146

## 2016-07-30 ENCOUNTER — Encounter: Payer: Self-pay | Admitting: *Deleted

## 2016-08-05 ENCOUNTER — Ambulatory Visit (INDEPENDENT_AMBULATORY_CARE_PROVIDER_SITE_OTHER): Payer: BLUE CROSS/BLUE SHIELD | Admitting: General Surgery

## 2016-08-05 ENCOUNTER — Encounter: Payer: Self-pay | Admitting: General Surgery

## 2016-08-05 VITALS — BP 104/64 | HR 70 | Resp 12 | Ht 67.5 in | Wt 179.0 lb

## 2016-08-05 DIAGNOSIS — R1011 Right upper quadrant pain: Secondary | ICD-10-CM | POA: Diagnosis not present

## 2016-08-05 NOTE — Progress Notes (Signed)
Patient ID: Beth Branch, female   DOB: [birth year], 50 y.o.   MRN: [id number]  Chief Complaint  Patient presents with  . Abdominal Pain    HPI Beth Branch is a 50 y.o. female here today for evaluation of pain near excision abdominal wall mass, CAVERNOUS HEMANGIOMA done 12-26-15.She states the pain has been going on for about 3 weeks. She states the pain is upper right abdominal pain that is dull and constant awareness. It does radiate to her right back. She does admit to nausea. She does not feel it is associated with any foods. She did she her PCP and thought muscle pain but it does not seem to be going away. She had been moving her father around a little more prior to his passing last week. I have reviewed the history of present illness with the patient.  HPI  Past Medical History:  Diagnosis Date  . Adopted   . BRCA negative 04-05-10  . Breast cancer ([place]) 2010   right breast DCIS  . History of blood clots 2011   LEFT ARM    Past Surgical History:  Procedure Laterality Date  . BREAST RECONSTRUCTION Bilateral 2011   Dr Greta Doom in [place]    . COMPLETE MASTECTOMY W/ SENTINEL NODE BIOPSY Bilateral 2010   Right breast DCIS  . EXCISION MASS ABDOMINAL N/A 12/26/2015   EXCISION MASS ABDOMINAL; CAVERNOUS HEMANGIOMA Onita Pfluger Robinette Haines, MD;  Location: [facility] ORS;  Service: General;  Laterality: N/A;  . KNEE ARTHROSCOPY Right     Family History  Problem Relation Age of Onset  . Adopted: Yes    Social History Social History  Substance Use Topics  . Smoking status: Never Smoker  . Smokeless tobacco: Never Used  . Alcohol use No    No Known Allergies  Current Outpatient Prescriptions  Medication Sig Dispense Refill  . b complex vitamins capsule Take by mouth.    . Coenzyme Q10 (CO Q 10 PO) Take 1 tablet by mouth daily.    Marland Kitchen doxycycline (DORYX) 100 MG EC tablet Take 100 mg by mouth 2 (two) times daily.    Marland Kitchen ibuprofen (ADVIL,MOTRIN) 800 MG tablet Take 1 tablet  (800 mg total) by mouth every 8 (eight) hours as needed for mild pain or moderate pain. 30 tablet 0  . KRILL OIL PO Take 1 tablet by mouth daily.    . Lactobacillus Rhamnosus, GG, (CULTURELLE IMMUNITY SUPPORT) CAPS Take by mouth.    . magnesium 30 MG tablet Take by mouth.    . Methylsulfonylmethane (MSM PO) Take 1 tablet by mouth daily.    . Multiple Vitamins-Minerals (HAIR SKIN AND NAILS FORMULA PO) Take by mouth daily.    . Omega-3 Fatty Acids (KP FISH OIL) 1200 MG CAPS Take by mouth.     No current facility-administered medications for this visit.     Review of Systems Review of Systems  Constitutional: Negative.   Respiratory: Negative.   Cardiovascular: Negative.   Gastrointestinal: Positive for abdominal pain and nausea. Negative for constipation and diarrhea.    Blood pressure 104/64, pulse 70, resp. rate 12, height 5' 7.5" (1.715 m), weight 179 lb (81.2 kg), last menstrual period 07/23/2016.  Physical Exam Physical Exam  Constitutional: She is oriented to person, place, and time. She appears well-developed and well-nourished.  Eyes: Conjunctivae are normal. No scleral icterus.  Neck: Neck supple.  Abdominal: Soft. Bowel sounds are normal. She exhibits no mass. There is no tenderness.  Neurological: She is alert and oriented to person, place, and time.  Skin: Skin is warm and dry.  Psychiatric: Her behavior is normal.    Data Reviewed Progress notes.  Assessment     RUQ abdominal wall pain. Likely muscular.     Plan    Recommend 2 Aleve twice a day for 2 weeks. Call office if pain persist.     This information has been scribed by Karie Fetch RN, BSN,BC.   Tierra Divelbiss G 08/05/2016, 9:59 AM

## 2016-08-05 NOTE — Patient Instructions (Addendum)
The patient is aware to call back for any questions or concerns. Recommend 2 Aleve twice a day for 2 weeks Call office if pain persist

## 2016-08-21 ENCOUNTER — Ambulatory Visit: Payer: BLUE CROSS/BLUE SHIELD | Admitting: General Surgery

## 2016-09-10 ENCOUNTER — Other Ambulatory Visit: Payer: Self-pay | Admitting: Surgical Oncology

## 2016-09-10 DIAGNOSIS — C221 Intrahepatic bile duct carcinoma: Secondary | ICD-10-CM

## 2016-09-21 ENCOUNTER — Ambulatory Visit
Admission: RE | Admit: 2016-09-21 | Discharge: 2016-09-21 | Disposition: A | Discharge status: Home / Self Care | Payer: BLUE CROSS/BLUE SHIELD | Source: Ambulatory Visit | Attending: Surgical Oncology | Admitting: Surgical Oncology

## 2016-09-21 DIAGNOSIS — C221 Intrahepatic bile duct carcinoma: Secondary | ICD-10-CM

## 2016-09-21 MED ORDER — GADOBENATE DIMEGLUMINE 529 MG/ML IV SOLN
17.0000 mL | Freq: Once | INTRAVENOUS | Status: AC | PRN
Start: 2016-09-21 — End: 2016-09-21
  Administered 2016-09-21: 17 mL via INTRAVENOUS

## 2016-09-23 ENCOUNTER — Other Ambulatory Visit: Payer: No Typology Code available for payment source

## 2016-10-30 ENCOUNTER — Ambulatory Visit
Admission: RE | Admit: 2016-10-30 | Discharge: 2016-10-30 | Disposition: A | Discharge status: Home / Self Care | Payer: Self-pay | Source: Ambulatory Visit | Attending: Family Medicine | Admitting: Family Medicine

## 2016-10-30 ENCOUNTER — Other Ambulatory Visit
Admission: RE | Admit: 2016-10-30 | Discharge: 2016-10-30 | Disposition: A | Discharge status: Home / Self Care | Payer: Self-pay | Source: Other Acute Inpatient Hospital | Attending: Family Medicine | Admitting: Family Medicine

## 2016-10-30 ENCOUNTER — Ambulatory Visit
Admission: EM | Admit: 2016-10-30 | Discharge: 2016-10-30 | Disposition: A | Discharge status: Home / Self Care | Payer: BLUE CROSS/BLUE SHIELD | Attending: Family Medicine | Admitting: Family Medicine

## 2016-10-30 DIAGNOSIS — M79605 Pain in left leg: Secondary | ICD-10-CM

## 2016-10-30 DIAGNOSIS — M7122 Synovial cyst of popliteal space [Baker], left knee: Secondary | ICD-10-CM

## 2016-10-30 DIAGNOSIS — Z8672 Personal history of thrombophlebitis: Secondary | ICD-10-CM

## 2016-10-30 DIAGNOSIS — M7989 Other specified soft tissue disorders: Secondary | ICD-10-CM

## 2016-10-30 MED ORDER — MELOXICAM 15 MG PO TABS: 15.0000 mg | ORAL_TABLET | Freq: Every day | ORAL | 0 refills | Status: DC

## 2016-10-30 NOTE — ED Triage Notes (Addendum)
Sudden onset of pain behind left knee and radiating into calf and back of thigh.  No pain at rest. No pain on standing. Pain increases to 8/10 with putting pressure on leg with walking. Mild swelling behind knee. No recent travel. No SOB

## 2016-10-30 NOTE — ED Provider Notes (Signed)
[facility]    CSN: [id number] Arrival date & time: 10/30/16  1831     History   Chief Complaint Chief Complaint  Patient presents with  . Leg Pain    HPI Beth Branch is a 51 y.o. female.   Patient is here because of pain behind her left leg. She's had a history of phlebitis before in the past. She reports doing some squats down all the way down as she puts it but partial squats yesterday. This afternoon she was going down some steps twisted her leg differently and felt a popping sensation sudden pain and numbness pain moving up her calf up into her left thigh. Should be noted that she's had cartilage removal in the right knee but she was told that the left knee was actually worsen her right knee because she's also had displacement of the patella of the left knee. She's has been traumatic injuries to her legs. She's also had a phlebitis that broke loose from her left arm when she was undergoing treatment for breast cancer she's very concerned and aware of phlebitis and thrombophlebitis and she reports no shortness of breath but course was concerned because of the swelling on the left knee that she may have a blood clot.   The history is provided by the patient. No language interpreter was used.  Leg Pain  Location:  Leg Injury: yes   Mechanism of injury comment:  Twisting of the L leg Leg location:  L leg Pain details:    Quality:  Aching, cramping and sharp   Radiates to:  L leg   Severity:  Moderate   Onset quality:  Sudden   Timing:  Constant Chronicity:  New Dislocation: no   Foreign body present:  No foreign bodies   Past Medical History:  Diagnosis Date  . Adopted   . BRCA negative 04-05-10  . Breast cancer ([place]) 2010   right breast DCIS  . History of blood clots 2011   LEFT ARM    Patient Active Problem List   Diagnosis Date Noted  . 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 History of blood clots   . Breast cancer [facility])       Past Surgical History:  Procedure Laterality Date  . BREAST RECONSTRUCTION Bilateral 2011   Dr Greta Doom in [place]    . COMPLETE MASTECTOMY W/ SENTINEL NODE BIOPSY Bilateral 2010   Right breast DCIS  . EXCISION MASS ABDOMINAL N/A 12/26/2015   EXCISION MASS ABDOMINAL; CAVERNOUS HEMANGIOMA Seeplaputhur Robinette Haines, MD;  Location: [facility] ORS;  Service: General;  Laterality: N/A;  . KNEE ARTHROSCOPY Right     OB History    Gravida Para Term Preterm AB Living   0 0 0 0 0 0   SAB TAB Ectopic Multiple Live Births   0 0 0 0        Obstetric Comments   1st Menstrual Cycle:  14        Home Medications    Prior to Admission medications   Medication Sig Start Date End Date Taking? Authorizing Provider  b complex vitamins capsule Take by mouth. 07/19/10   Historical Provider, MD  Coenzyme Q10 (CO Q 10 PO) Take 1 tablet by mouth daily.    Historical Provider, MD  doxycycline (DORYX) 100 MG EC tablet Take 100 mg by mouth 2 (two) times daily.    Historical Provider, MD  ibuprofen (ADVIL,MOTRIN) 800 MG tablet Take  1 tablet (800 mg total) by mouth every 8 (eight) hours as needed for mild pain or moderate pain. 08/27/15   Aura Fey Betancourt, NP  KRILL OIL PO Take 1 tablet by mouth daily.    Historical Provider, MD  Lactobacillus Rhamnosus, GG, (CULTURELLE IMMUNITY SUPPORT) CAPS Take by mouth. 07/19/10   Historical Provider, MD  magnesium 30 MG tablet Take by mouth.    Historical Provider, MD  meloxicam (MOBIC) 15 MG tablet Take 1 tablet (15 mg total) by mouth daily. 10/30/16   Frederich Cha, MD  Methylsulfonylmethane (MSM PO) Take 1 tablet by mouth daily.    Historical Provider, MD  Multiple Vitamins-Minerals (HAIR SKIN AND NAILS FORMULA PO) Take by mouth daily.    Historical Provider, MD  Omega-3 Fatty Acids (KP FISH OIL) 1200 MG CAPS Take by mouth.    Historical Provider, MD    Family History Family History  Problem Relation Age of Onset  . Adopted: Yes    Social History Social  History  Substance Use Topics  . Smoking status: Never Smoker  . Smokeless tobacco: Never Used  . Alcohol use No     Allergies   Patient has no known allergies.   Review of Systems Review of Systems  Musculoskeletal: Positive for gait problem and myalgias.  Skin: Negative.   All other systems reviewed and are negative.    Physical Exam Triage Vital Signs ED Triage Vitals  Enc Vitals Group     BP --      Pulse --      Resp --      Temp --      Temp src --      SpO2 --      Weight 10/30/16 1912 174 lb (78.9 kg)     Height 10/30/16 1912 5' 7.5" (1.715 m)     Head Circumference --      Peak Flow --      Pain Score 10/30/16 1915 0     Pain Loc --      Pain Edu? --      Excl. in [place]? --    No data found.   Updated Vital Signs Ht 5' 7.5" (1.715 m)   Wt 174 lb (78.9 kg)   LMP 10/29/2016   BMI 26.85 kg/m   Visual Acuity Right Eye Distance:   Left Eye Distance:   Bilateral Distance:    Right Eye Near:   Left Eye Near:    Bilateral Near:     Physical Exam  Constitutional: She appears well-developed and well-nourished.  HENT:  Head: Normocephalic and atraumatic.  Eyes: Pupils are equal, round, and reactive to light.  Pulmonary/Chest: Effort normal.  Musculoskeletal: She exhibits tenderness. She exhibits no deformity.       Left lower leg: She exhibits tenderness and swelling.       Legs: Swelling tenderness in the popliteal area of her left knee there is tenderness along the medial upper left thigh as well.  Neurological: She is alert.  Skin: Skin is warm.  Psychiatric: She has a normal mood and affect.  Vitals reviewed.    UC Treatments / Results  Labs (all labs ordered are listed, but only abnormal results are displayed) Labs Reviewed - No data to display  EKG  EKG Interpretation None       Radiology No results found.  Procedures Procedures (including critical care time)  Medications Ordered in UC Medications - No data to  display   Initial Impression /  Assessment and Plan / UC Course  I have reviewed the triage vital signs and the nursing notes.  Pertinent labs & imaging results that were available during my care of the patient were reviewed by me and considered in my medical decision making (see chart for details).  Clinical Course     X-ray patient I think she has is a Baker's cyst that occurred when she twisted her leg going down the steps however with a history of phlebitis I do think it is warranted to have ultrasound done to make sure there is no blood clots present. We'll send her to [facility] for ultrasound study and test. If there is a Baker's cyst recommend follow-up with orthopedic of her choice for possible aspiration and or surgical repair will place on Mobic 15 mg 1 tablet for pain work note given for today and tomorrow as well.  Final Clinical Impressions(s) / UC Diagnoses   Final diagnoses:  Left leg swelling  Left leg pain  Baker's cyst of knee, left  Hx of phlebitis    New Prescriptions New Prescriptions   MELOXICAM (MOBIC) 15 MG TABLET    Take 1 tablet (15 mg total) by mouth daily.     Frederich Cha, MD 10/30/16 2001

## 2016-11-11 DIAGNOSIS — M25562 Pain in left knee: Secondary | ICD-10-CM | POA: Insufficient documentation

## 2016-11-16 DIAGNOSIS — M5030 Other cervical disc degeneration, unspecified cervical region: Secondary | ICD-10-CM | POA: Insufficient documentation

## 2016-11-16 DIAGNOSIS — K7689 Other specified diseases of liver: Secondary | ICD-10-CM

## 2016-11-16 HISTORY — DX: Other specified diseases of liver: K76.89

## 2016-11-16 HISTORY — DX: Other cervical disc degeneration, unspecified cervical region: M50.30

## 2017-02-23 DIAGNOSIS — R87619 Unspecified abnormal cytological findings in specimens from cervix uteri: Secondary | ICD-10-CM | POA: Insufficient documentation

## 2017-03-25 ENCOUNTER — Ambulatory Visit (INDEPENDENT_AMBULATORY_CARE_PROVIDER_SITE_OTHER): Payer: PRIVATE HEALTH INSURANCE | Admitting: General Surgery

## 2017-03-25 ENCOUNTER — Encounter: Payer: Self-pay | Admitting: General Surgery

## 2017-03-25 VITALS — BP 102/66 | HR 88 | Resp 12 | Ht 67.0 in | Wt 181.0 lb

## 2017-03-25 DIAGNOSIS — Z1211 Encounter for screening for malignant neoplasm of colon: Secondary | ICD-10-CM

## 2017-03-25 DIAGNOSIS — Z86000 Personal history of in-situ neoplasm of breast: Secondary | ICD-10-CM | POA: Diagnosis not present

## 2017-03-25 NOTE — Progress Notes (Signed)
Patient ID: Beth Branch, female   DOB: 12/29/1965, 51 y.o.   MRN: [id number]  Chief Complaint  Patient presents with  . Follow-up    HPI Beth Branch is a 51 y.o. female here today for her one year breast cancer follow up. Patient states she is doing well. Complains of a bump on her groin. HPI  Past Medical History:  Diagnosis Date  . Adopted   . BRCA negative 04-05-10  . Breast cancer ([place]) 2010   right breast DCIS  . History of blood clots 2011   LEFT ARM    Past Surgical History:  Procedure Laterality Date  . BREAST RECONSTRUCTION Bilateral 2011   Dr Greta Doom in [place]    . COMPLETE MASTECTOMY W/ SENTINEL NODE BIOPSY Bilateral 2010   Right breast DCIS  . EXCISION MASS ABDOMINAL N/A 12/26/2015   EXCISION MASS ABDOMINAL; CAVERNOUS HEMANGIOMA Seeplaputhur Robinette Haines, MD;  Location: [facility] ORS;  Service: General;  Laterality: N/A;  . KNEE ARTHROSCOPY Right     Family History  Problem Relation Age of Onset  . Adopted: Yes    Social History Social History  Substance Use Topics  . Smoking status: Never Smoker  . Smokeless tobacco: Never Used  . Alcohol use No    No Known Allergies  Current Outpatient Prescriptions  Medication Sig Dispense Refill  . b complex vitamins capsule Take by mouth.    . cholecalciferol (VITAMIN D) 1000 units tablet Take 1,000 Units by mouth daily.    . Coenzyme Q10 (CO Q 10 PO) Take 1 tablet by mouth daily.    Marland Kitchen doxycycline (DORYX) 100 MG EC tablet Take 100 mg by mouth 2 (two) times daily.    Marland Kitchen ibuprofen (ADVIL,MOTRIN) 800 MG tablet Take 1 tablet (800 mg total) by mouth every 8 (eight) hours as needed for mild pain or moderate pain. 30 tablet 0  . KRILL OIL PO Take 1 tablet by mouth daily.    . Lactobacillus Rhamnosus, GG, (CULTURELLE IMMUNITY SUPPORT) CAPS Take by mouth.    . magnesium 30 MG tablet Take by mouth.    . meloxicam (MOBIC) 15 MG tablet Take 1 tablet (15 mg total) by mouth daily. 30 tablet 0  .  Methylsulfonylmethane (MSM PO) Take 1 tablet by mouth daily.    . Multiple Vitamins-Minerals (HAIR SKIN AND NAILS FORMULA PO) Take by mouth daily.    . Omega-3 Fatty Acids (KP FISH OIL) 1200 MG CAPS Take by mouth.     No current facility-administered medications for this visit.     Review of Systems Review of Systems  Constitutional: Negative.   Respiratory: Negative.   Cardiovascular: Negative.     Blood pressure 102/66, pulse 88, resp. rate 12, height _0  (1.702 m), weight 181 lb (82.1 kg).  Physical Exam Physical Exam  Constitutional: She is oriented to person, place, and time. She appears well-developed and well-nourished.  Eyes: Conjunctivae are normal. No scleral icterus.  Neck: Neck supple.  Cardiovascular: Normal rate, regular rhythm and normal heart sounds.   Pulmonary/Chest: Effort normal and breath sounds normal. Right breast exhibits no inverted nipple, no mass, no nipple discharge, no skin change and no tenderness. Left breast exhibits no inverted nipple, no mass, no nipple discharge, no skin change and no tenderness.  Abdominal: Soft. Normal appearance and bowel sounds are normal.    Lymphadenopathy:    She has no cervical adenopathy.    She has no axillary adenopathy.  Right: Inguinal adenopathy present.       Left: Inguinal adenopathy present.  Neurological: She is alert and oriented to person, place, and time.  Skin: Skin is warm and dry.  Psychiatric: She has a normal mood and affect. Her behavior is normal.    Data Reviewed Prior notes reviewed.  Assessment     History of right breast DCIS- status post bilateral mastectomy with reconstruction.  Status post excision of abdominal wall mass, cavernous hemangioma. Screening Colonoscopy- history for DCIS and pt being adopted pt said she will call to make an appointment after her insurance changes Bilateral inguinal lymph adenopathy- likely benign,history of mild cervical dysplasia. Monitor for now, pt  advised to f/u if nodes increase in size. Stable exam  Plan    Colonoscopy with possible biopsy/polypectomy prn: Information regarding the procedure, including its potential risks and complications (including but not limited to perforation of the bowel, which may require emergency surgery to repair, and bleeding) was verbally given to the patient. Educational information regarding lower intestinal endoscopy was given to the patient. Written instructions for how to complete the bowel prep using Miralax were provided. The importance of drinking ample fluids to avoid dehydration as a result of the prep emphasized.  Follow up in one year with Dr. Bary Castilla. Advised with call with any questions or concerns.     HPI, Physical Exam, Assessment and Plan have been scribed under the direction and in the presence of Mckinley Jewel, MD  Gaspar Cola, CMA  I have completed the exam and reviewed the above documentation for accuracy and completeness.  I agree with the above.  Haematologist has been used and any errors in dictation or transcription are unintentional.  Seeplaputhur G. Jamal Collin, M.D., F.A.C.S.   Junie Panning G 03/25/2017, 4:11 PM

## 2017-03-25 NOTE — Patient Instructions (Addendum)
Follow up in one year with Dr. Bary Castilla. Advised with call with any questions or concerns.     Colonoscopy, Adult A colonoscopy is an exam to look at the entire large intestine. During the exam, a lubricated, bendable tube is inserted into the anus and then passed into the rectum, colon, and other parts of the large intestine. A colonoscopy is often done as a part of normal colorectal screening or in response to certain symptoms, such as anemia, persistent diarrhea, abdominal pain, and blood in the stool. The exam can help screen for and diagnose medical problems, including:  Tumors.  Polyps.  Inflammation.  Areas of bleeding.  Tell a health care provider about:  Any allergies you have.  All medicines you are taking, including vitamins, herbs, eye drops, creams, and over-the-counter medicines.  Any problems you or family members have had with anesthetic medicines.  Any blood disorders you have.  Any surgeries you have had.  Any medical conditions you have.  Any problems you have had passing stool. What are the risks? Generally, this is a safe procedure. However, problems may occur, including:  Bleeding.  A tear in the intestine.  A reaction to medicines given during the exam.  Infection (rare).  What happens before the procedure? Eating and drinking restrictions Follow instructions from your health care provider about eating and drinking, which may include:  A few days before the procedure - follow a low-fiber diet. Avoid nuts, seeds, dried fruit, raw fruits, and vegetables.  1-3 days before the procedure - follow a clear liquid diet. Drink only clear liquids, such as clear broth or bouillon, black coffee or tea, clear juice, clear soft drinks or sports drinks, gelatin dessert, and popsicles. Avoid any liquids that contain red or purple dye.  On the day of the procedure - do not eat or drink anything during the 2 hours before the procedure, or within the time period  that your health care provider recommends.  Bowel prep If you were prescribed an oral bowel prep to clean out your colon:  Take it as told by your health care provider. Starting the day before your procedure, you will need to drink a large amount of medicated liquid. The liquid will cause you to have multiple loose stools until your stool is almost clear or light green.  If your skin or anus gets irritated from diarrhea, you may use these to relieve the irritation: ? Medicated wipes, such as adult wet wipes with aloe and vitamin E. ? A skin soothing-product like petroleum jelly.  If you vomit while drinking the bowel prep, take a break for up to 60 minutes and then begin the bowel prep again. If vomiting continues and you cannot take the bowel prep without vomiting, call your health care provider.  General instructions  Ask your health care provider about changing or stopping your regular medicines. This is especially important if you are taking diabetes medicines or blood thinners.  Plan to have someone take you home from the hospital or clinic. What happens during the procedure?  An IV tube may be inserted into one of your veins.  You will be given medicine to help you relax (sedative).  To reduce your risk of infection: ? Your health care team will wash or sanitize their hands. ? Your anal area will be washed with soap.  You will be asked to lie on your side with your knees bent.  Your health care provider will lubricate a long, thin, flexible tube.  The tube will have a camera and a light on the end.  The tube will be inserted into your anus.  The tube will be gently eased through your rectum and colon.  Air will be delivered into your colon to keep it open. You may feel some pressure or cramping.  The camera will be used to take images during the procedure.  A small tissue sample may be removed from your body to be examined under a microscope (biopsy). If any potential  problems are found, the tissue will be sent to a lab for testing.  If small polyps are found, your health care provider may remove them and have them checked for cancer cells.  The tube that was inserted into your anus will be slowly removed. The procedure may vary among health care providers and hospitals. What happens after the procedure?  Your blood pressure, heart rate, breathing rate, and blood oxygen level will be monitored until the medicines you were given have worn off.  Do not drive for 24 hours after the exam.  You may have a small amount of blood in your stool.  You may pass gas and have mild abdominal cramping or bloating due to the air that was used to inflate your colon during the exam.  It is up to you to get the results of your procedure. Ask your health care provider, or the department performing the procedure, when your results will be ready. This information is not intended to replace advice given to you by your health care provider. Make sure you discuss any questions you have with your health care provider. Document Released: 10/03/2000 Document Revised: 08/06/2016 Document Reviewed: 12/18/2015 Elsevier Interactive Patient Education  2018 [employer].

## 2017-04-08 ENCOUNTER — Ambulatory Visit: Payer: BLUE CROSS/BLUE SHIELD | Admitting: General Surgery

## 2017-04-24 DIAGNOSIS — M25551 Pain in right hip: Secondary | ICD-10-CM | POA: Insufficient documentation

## 2017-05-08 ENCOUNTER — Telehealth: Payer: Self-pay | Admitting: General Surgery

## 2017-05-08 NOTE — Telephone Encounter (Signed)
PATIENT CALLED & WOULD LIKED FOR [place] TO MAKE AN APPOINTMENT FOR YOU TO SEE HER FOR A GROIN LYMPH NODE.PLEASE ADVISE. PATIENT WAS INSTRUCTED TO CALL BACK ON Wednesday IF SHE HAD NOT HEARD ANYTHING.

## 2017-05-13 ENCOUNTER — Ambulatory Visit (INDEPENDENT_AMBULATORY_CARE_PROVIDER_SITE_OTHER): Payer: PRIVATE HEALTH INSURANCE | Admitting: General Surgery

## 2017-05-13 ENCOUNTER — Encounter: Payer: Self-pay | Admitting: General Surgery

## 2017-05-13 VITALS — BP 104/70 | HR 78 | Resp 12 | Ht 67.0 in | Wt 178.0 lb

## 2017-05-13 DIAGNOSIS — R1031 Right lower quadrant pain: Secondary | ICD-10-CM

## 2017-05-13 NOTE — Progress Notes (Signed)
Patient ID: Beth Branch, female   DOB: 1966/03/01, 51 y.o.   MRN: [id number]  Chief Complaint  Patient presents with  . Pain    HPI Beth Branch is a 51 y.o. female here for evaluation of bilateral groin lymph nodes. She is having pain in right groin that radiates to thigh area. She has noticed this for a couple of weeks. She has been going to the gym 3 times a week for several years. She has been doing a hip flexor machine at the gym and increased weights but not sure if that has caused the problem.   HPI  Past Medical History:  Diagnosis Date  . Adopted   . BRCA negative 04-05-10  . Breast cancer ([place]) 2010   right breast DCIS  . History of blood clots 2011   LEFT ARM    Past Surgical History:  Procedure Laterality Date  . BREAST RECONSTRUCTION Bilateral 2011   Dr Greta Doom in [place]    . COMPLETE MASTECTOMY W/ SENTINEL NODE BIOPSY Bilateral 2010   Right breast DCIS  . EXCISION MASS ABDOMINAL N/A 12/26/2015   EXCISION MASS ABDOMINAL; CAVERNOUS HEMANGIOMA Seeplaputhur Robinette Haines, MD;  Location: [facility] ORS;  Service: General;  Laterality: N/A;  . KNEE ARTHROSCOPY Right     Family History  Problem Relation Age of Onset  . Adopted: Yes    Social History Social History  Substance Use Topics  . Smoking status: Never Smoker  . Smokeless tobacco: Never Used  . Alcohol use No    No Known Allergies  Current Outpatient Prescriptions  Medication Sig Dispense Refill  . b complex vitamins capsule Take by mouth.    . cholecalciferol (VITAMIN D) 1000 units tablet Take 1,000 Units by mouth daily.    . Coenzyme Q10 (CO Q 10 PO) Take 1 tablet by mouth daily.    Marland Kitchen ibuprofen (ADVIL,MOTRIN) 800 MG tablet Take 1 tablet (800 mg total) by mouth every 8 (eight) hours as needed for mild pain or moderate pain. 30 tablet 0  . KRILL OIL PO Take 1 tablet by mouth daily.    . Lactobacillus Rhamnosus, GG, (CULTURELLE IMMUNITY SUPPORT) CAPS Take by mouth.    . magnesium 30 MG  tablet Take by mouth.    . Methylsulfonylmethane (MSM PO) Take 1 tablet by mouth daily.    . Multiple Vitamins-Minerals (HAIR SKIN AND NAILS FORMULA PO) Take by mouth daily.    . Omega-3 Fatty Acids (KP FISH OIL) 1200 MG CAPS Take by mouth.     No current facility-administered medications for this visit.     Review of Systems Review of Systems  Constitutional: Negative.   Respiratory: Negative.   Cardiovascular: Negative.     Blood pressure 104/70, pulse 78, resp. rate 12, height '5\' 7"'  (1.702 m), weight 178 lb (80.7 kg), last menstrual period 04/22/2017.  Physical Exam Physical Exam  Constitutional: She is oriented to person, place, and time. She appears well-developed and well-nourished.  Eyes: Conjunctivae are normal. No scleral icterus.  Lymphadenopathy:    She has no cervical adenopathy.    She has no axillary adenopathy.       Right: Inguinal (one small, 1 cm node ) adenopathy present.       Left: Inguinal (one small, 1 cm node ) adenopathy present.  Neurological: She is alert and oriented to person, place, and time.  Skin: Skin is warm and dry.  Psychiatric: She has a normal mood and affect.  Data Reviewed Prior note  Assessment    Hip and thigh pain - most likely due to musculoskeletal cause. Lymph nodes are small and do not appear to be suspicious, most likely unrelated.      Plan   Return as needed. Pt advised fully     HPI, Physical Exam, Assessment and Plan have been scribed under the direction and in the presence of Mckinley Jewel, MD Karie Fetch, RN  I have completed the exam and reviewed the above documentation for accuracy and completeness.  I agree with the above.  Haematologist has been used and any errors in dictation or transcription are unintentional.  Seeplaputhur G. Jamal Collin, M.D., F.A.C.S. Junie Panning G 05/13/2017, 10:48 AM

## 2017-05-13 NOTE — Patient Instructions (Signed)
The patient is aware to call back for any questions or concerns.  

## 2017-06-29 DIAGNOSIS — D0510 Intraductal carcinoma in situ of unspecified breast: Secondary | ICD-10-CM | POA: Insufficient documentation

## 2017-08-13 ENCOUNTER — Ambulatory Visit (INDEPENDENT_AMBULATORY_CARE_PROVIDER_SITE_OTHER): Payer: No Typology Code available for payment source | Admitting: Urology

## 2017-08-13 ENCOUNTER — Encounter: Payer: Self-pay | Admitting: Urology

## 2017-08-13 VITALS — BP 134/82 | HR 93 | Ht 67.0 in | Wt 185.0 lb

## 2017-08-13 DIAGNOSIS — R339 Retention of urine, unspecified: Secondary | ICD-10-CM | POA: Diagnosis not present

## 2017-08-13 DIAGNOSIS — R350 Frequency of micturition: Secondary | ICD-10-CM

## 2017-08-13 DIAGNOSIS — N368 Other specified disorders of urethra: Secondary | ICD-10-CM

## 2017-08-13 DIAGNOSIS — D1800 Hemangioma unspecified site: Secondary | ICD-10-CM

## 2017-08-13 HISTORY — DX: Hemangioma unspecified site: D18.00

## 2017-08-13 LAB — URINALYSIS, COMPLETE
BILIRUBIN UA: NEGATIVE
Glucose, UA: NEGATIVE
Ketones, UA: NEGATIVE
Leukocytes, UA: NEGATIVE
Nitrite, UA: NEGATIVE
PH UA: 7 (ref 5.0–7.5)
PROTEIN UA: NEGATIVE
Specific Gravity, UA: <1.005 — ABNORMAL LOW (ref 1.005–1.030)
Urobilinogen, Ur: 0.2 mg/dL (ref 0.2–1.0)

## 2017-08-13 LAB — BLADDER SCAN AMB NON-IMAGING

## 2017-08-14 DIAGNOSIS — R350 Frequency of micturition: Secondary | ICD-10-CM | POA: Insufficient documentation

## 2017-08-14 DIAGNOSIS — R339 Retention of urine, unspecified: Secondary | ICD-10-CM | POA: Insufficient documentation

## 2017-08-14 DIAGNOSIS — N368 Other specified disorders of urethra: Secondary | ICD-10-CM | POA: Insufficient documentation

## 2017-08-14 NOTE — Progress Notes (Signed)
08/13/2017 7:26 AM   Beth Branch General 27-Nov-1965 [id number]  Referring provider: Maryruth Hancock, MD [address] [place], [place] [address]  Chief Complaint  Patient presents with  . Urinary Frequency    New patient  . Hematuria    HPI: Beth Branch is a 51 yo female who presents for evaluation of urethral spotting.  She states her symptoms started after a hysteroscopy in May 2018.  She had a UTI postoperatively and since that time complains of urinary frequency and dull suprapubic pressure.  Last month she had an episode of blood streaking on the toilet paper after wiping.  She denies gross hematuria.  Several follow-up urinalysis have not shown microhematuria.  She had a recent appointment with a urologist in the area and cystoscopy is recommended however she states she declined when told that he only used lidocaine gel for men and not women.  She denies bowel symptoms.  Urologic history remarkable for degenerative disc disease in the cervical spine.  PMH: Past Medical History:  Diagnosis Date  . Adopted   . BRCA negative 04-05-10  . 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 S/p excision by [facility] 12/18/2015.  Marland Kitchen Cyst of tendon sheath 09/28/2012   Overview:  L hand, along the extensor tendons of the long and ring fingers ->[facility] 09/2012. Opted to observe.   . Degenerative disc disease, cervical 11/16/2016   Overview:  Had neck pain ->>Plain films 09/2015 (referred to PT).   . Dysmenorrhea 10/11/2013  . Fibroids, intramural 10/11/2013  . Hemangioma 08/13/2017   Overview:  cavernous hemangioma abdominal wall- removed   . History of blood clots 2011   LEFT ARM  . Intramural leiomyoma of uterus 09/11/2015   Overview:  Pelvic US 07/2015. Followed by Dr. Kenton Kingfisher - OBGYN.   Marland Kitchen Keloid of skin 12/26/2013  . Liver cyst 11/16/2016   Overview:  Has small  benign appearing liver cysts on CT A/P + MRI Liver (2017). Had consult w/ [facility] and found to also have a benign hemangioma.   . Pap smear abnormality of cervix with ASCUS favoring benign 08/20/2015  . Postinflammatory hyperpigmentation 12/12/2013  . Seborrheic dermatitis 03/01/2014    Surgical History: Past Surgical History:  Procedure Laterality Date  . BREAST RECONSTRUCTION Bilateral 2011   Dr Greta Doom in [place]    . COMPLETE MASTECTOMY W/ SENTINEL NODE BIOPSY Bilateral 2010   Right breast DCIS  . EXCISION MASS ABDOMINAL N/A 12/26/2015   EXCISION MASS ABDOMINAL; CAVERNOUS HEMANGIOMA Seeplaputhur Robinette Haines, MD;  Location: [facility] ORS;  Service: General;  Laterality: N/A;  . KNEE ARTHROSCOPY Right     Home Medications:  Allergies as of 08/13/2017   No Known Allergies     Medication List       Accurate as of 08/13/17 11:59 PM. Always use your most recent med list.          b complex vitamins capsule Take by mouth.   cholecalciferol 1000 units tablet Commonly known as:  VITAMIN D Take 1,000 Units by mouth daily.   CO Q 10 PO Take 1 tablet by mouth daily.   CULTURELLE IMMUNITY SUPPORT Caps Take by mouth.   HAIR SKIN AND NAILS FORMULA PO Take by mouth daily.   ibuprofen 800 MG tablet Commonly known as:  ADVIL,MOTRIN Take 1 tablet (800 mg total)  by mouth every 8 (eight) hours as needed for mild pain or moderate pain.   KP FISH OIL 1200 MG Caps Take by mouth.   KRILL OIL PO Take 1 tablet by mouth daily.   magnesium 30 MG tablet Take by mouth.   MSM PO Take 1 tablet by mouth daily.       Allergies: No Known Allergies  Family History: Family History  Problem Relation Age of Onset  . Adopted: Yes  . Prostate cancer Neg Hx   . Bladder Cancer Neg Hx   . Kidney cancer Neg Hx     Social History:  reports that she has never smoked. She has never used smokeless tobacco. She reports that she does not drink alcohol or use  drugs.  ROS: UROLOGY Frequent Urination?: Yes Hard to postpone urination?: No Burning/pain with urination?: No Get up at night to urinate?: Yes Leakage of urine?: No Urine stream starts and stops?: Yes Trouble starting stream?: No Do you have to strain to urinate?: No Blood in urine?: Yes Urinary tract infection?: No Sexually transmitted disease?: No Injury to kidneys or bladder?: No Painful intercourse?: No Weak stream?: No Currently pregnant?: No Vaginal bleeding?: No Last menstrual period?: 10-15  Gastrointestinal Nausea?: No Vomiting?: No Indigestion/heartburn?: No Diarrhea?: No Constipation?: No  Constitutional Fever: No Night sweats?: No Weight loss?: No Fatigue?: No  Skin Skin rash/lesions?: No Itching?: No  Eyes Blurred vision?: No Double vision?: No  Ears/Nose/Throat Sore throat?: No Sinus problems?: No  Hematologic/Lymphatic Swollen glands?: No Easy bruising?: No  Cardiovascular Leg swelling?: No Chest pain?: No  Respiratory Cough?: No Shortness of breath?: No  Endocrine Excessive thirst?: No  Musculoskeletal Back pain?: No Joint pain?: Yes  Neurological Headaches?: No Dizziness?: No  Psychologic Depression?: No Anxiety?: No  Physical Exam: BP 134/82   Pulse 93   Ht '5\' 7"'  (1.702 m)   Wt 185 lb (83.9 kg)   LMP 08/03/2017 (Approximate)   BMI 28.98 kg/m   Constitutional:  Alert and oriented, No acute distress. HEENT: [place] AT, moist mucus membranes.  Trachea midline, no masses. Cardiovascular: No clubbing, cyanosis, or edema. Respiratory: Normal respiratory effort, no increased work of breathing. GI: Abdomen is soft, nontender, nondistended, no abdominal masses GU: No CVA tenderness.  Skin: No rashes, bruises or suspicious lesions. Lymph: No cervical or inguinal adenopathy. Neurologic: Grossly intact, no focal deficits, moving all 4 extremities. Psychiatric: Normal mood and affect.  Laboratory Data: Lab Results   Component Value Date   WBC 7.0 12/16/2012   HGB 13.4 12/16/2012   HCT 39.2 12/16/2012   MCV 93.1 12/16/2012   PLT 287 12/16/2012    Lab Results  Component Value Date   CREATININE 0.79 12/16/2012    Urinalysis Lab Results  Component Value Date   SPECGRAV <1.005 (L) 08/13/2017   PHUR 7.0 08/13/2017   COLORU Yellow 08/13/2017   APPEARANCEUR Clear 08/13/2017   LEUKOCYTESUR Negative 08/13/2017   PROTEINUR Negative 08/13/2017   GLUCOSEU Negative 08/13/2017   KETONESU Negative 08/13/2017   RBCU Trace (A) 08/13/2017   BILIRUBINUR Negative 08/13/2017   UUROB 0.2 08/13/2017   NITRITE Negative 08/13/2017    Assessment & Plan:    1. Urethral bleeding She will return for cystoscopy.  Pelvic exam to be performed at the time of cystoscopy.  Urinalysis today was clear.  - Urinalysis, Complete  2. Urinary frequency Elevated PVR at 204 mL by bladder scan.  Will repeat at time of cystoscopy.  - BLADDER SCAN AMB NON-IMAGING  3. Incomplete bladder emptying As above.    Abbie Sons, [place] [address], [place], [place] [address] [phone]

## 2017-08-18 ENCOUNTER — Ambulatory Visit (INDEPENDENT_AMBULATORY_CARE_PROVIDER_SITE_OTHER): Payer: No Typology Code available for payment source | Admitting: Urology

## 2017-08-18 VITALS — BP 133/75 | HR 99 | Ht 67.0 in | Wt 185.0 lb

## 2017-08-18 DIAGNOSIS — N368 Other specified disorders of urethra: Secondary | ICD-10-CM

## 2017-08-18 LAB — URINALYSIS, COMPLETE
Bilirubin, UA: NEGATIVE
Glucose, UA: NEGATIVE
Ketones, UA: NEGATIVE
Leukocytes, UA: NEGATIVE
NITRITE UA: NEGATIVE
PH UA: 5.5 (ref 5.0–7.5)
PROTEIN UA: NEGATIVE
RBC, UA: NEGATIVE
Urobilinogen, Ur: 0.2 mg/dL (ref 0.2–1.0)

## 2017-08-18 MED ORDER — CIPROFLOXACIN HCL 500 MG PO TABS
500.0000 mg | ORAL_TABLET | Freq: Once | ORAL | Status: AC
  Administered 2017-08-18: 500 mg via ORAL

## 2017-08-18 MED ORDER — LIDOCAINE HCL 2 % EX GEL
1.0000 "application " | Freq: Once | CUTANEOUS | Status: AC
  Administered 2017-08-18: 1 via URETHRAL

## 2017-08-19 NOTE — Progress Notes (Signed)
   08/19/17  CC:  Chief Complaint  Patient presents with  . Cysto    HPI: Seen for urethral bleeding on 10/25.  Refer to office note of that date.  She has no complaints today.  Blood pressure 133/75, pulse 99, height 5\' 7"  (1.702 m), weight 185 lb (83.9 kg), last menstrual period 08/03/2017.   Cystoscopy Procedure Note  Patient identification was confirmed, informed consent was obtained, and patient was prepped using Betadine solution.  Lidocaine jelly was administered per urethral meatus.    Preoperative abx where received prior to procedure.    Procedure: - Flexible cystoscope was unable to be introduced secondary to a strictured area in the mid urethra which would not allow passage of the cystoscope.  Post-Procedure: - Patient tolerated the procedure well  Assessment/ Plan: Urethral stricture.  Discomfort during cystoscopy and would not be able to tolerate in office dilation.  Will schedule cystoscopy with dilation under anesthesia/sedation.  The procedure was discussed in full detail including potential risks of bleeding, infection and recurrent stricture.  All questions were answered and she desires to proceed.   Abbie Sons, MD

## 2017-08-19 NOTE — H&P (View-Only) (Signed)
   08/19/17  CC:  Chief Complaint  Patient presents with  . Cysto    HPI: Seen for urethral bleeding on 10/25.  Refer to office note of that date.  She has no complaints today.  Blood pressure 133/75, pulse 99, height 5\' 7"  (1.702 m), weight 185 lb (83.9 kg), last menstrual period 08/03/2017.   Cystoscopy Procedure Note  Patient identification was confirmed, informed consent was obtained, and patient was prepped using Betadine solution.  Lidocaine jelly was administered per urethral meatus.    Preoperative abx where received prior to procedure.    Procedure: - Flexible cystoscope was unable to be introduced secondary to a strictured area in the mid urethra which would not allow passage of the cystoscope.  Post-Procedure: - Patient tolerated the procedure well  Assessment/ Plan: Urethral stricture.  Discomfort during cystoscopy and would not be able to tolerate in office dilation.  Will schedule cystoscopy with dilation under anesthesia/sedation.  The procedure was discussed in full detail including potential risks of bleeding, infection and recurrent stricture.  All questions were answered and she desires to proceed.   Abbie Sons, MD

## 2017-08-21 LAB — CULTURE, URINE COMPREHENSIVE

## 2017-08-25 ENCOUNTER — Ambulatory Visit: Payer: Self-pay | Admitting: Urology

## 2017-08-25 ENCOUNTER — Telehealth: Payer: Self-pay | Admitting: Urology

## 2017-08-25 NOTE — Telephone Encounter (Signed)
Patient is calling and asking for a phone call from you, she has some questions about her upcoming surgery that she would like to discuss with you prior to having that done. If you could call her @ [phone] please.  Thanks,  Sharyn Lull

## 2017-08-28 ENCOUNTER — Other Ambulatory Visit: Payer: Self-pay

## 2017-08-28 ENCOUNTER — Encounter
Admission: RE | Admit: 2017-08-28 | Discharge: 2017-08-28 | Disposition: A | Discharge status: Home / Self Care | Payer: No Typology Code available for payment source | Source: Ambulatory Visit | Attending: Urology | Admitting: Urology

## 2017-08-28 HISTORY — DX: Family history of other specified conditions: Z84.89

## 2017-08-28 HISTORY — DX: Other complications of anesthesia, initial encounter: T88.59XA

## 2017-08-28 HISTORY — DX: Gastro-esophageal reflux disease without esophagitis: K21.9

## 2017-08-28 HISTORY — DX: Adverse effect of unspecified anesthetic, initial encounter: T41.45XA

## 2017-08-28 NOTE — Patient Instructions (Signed)
Your procedure is scheduled on: 09-04-17  Report to Same Day Surgery 2nd floor medical mall [facility] Entrance-take elevator on left to 2nd floor.  Check in with surgery information desk.) To find out your arrival time please call [phone] between 1PM - 3PM on 09-03-17  Remember: Instructions that are not followed completely may result in serious medical risk, up to and including death, or upon the discretion of your surgeon and anesthesiologist your surgery may need to be rescheduled.    _x___ 1. Do not eat food after midnight the night before your procedure. NO GUM CHEWING OR CANDY AFTER MIDNIGHT.  You may drink clear liquids up to 2 hours before you are scheduled to arrive at the hospital for your procedure.  Do not drink clear liquids within 2 hours of your scheduled arrival to the hospital.  Clear liquids include  --Water or Apple juice without pulp  --Clear carbohydrate beverage such as ClearFast or Gatorade  --Black Coffee or Clear Tea (No milk, no creamers, do not add anything to  the coffee or Tea       __x__ 2. No Alcohol for 24 hours before or after surgery.   __x__3. No Smoking for 24 prior to surgery.   ____  4. Bring all medications with you on the day of surgery if instructed.    __x__ 5. Notify your doctor if there is any change in your medical condition     (cold, fever, infections).     Do not wear jewelry, make-up, hairpins, clips or nail polish.  Do not wear lotions, powders, or perfumes. You may wear deodorant.  Do not shave 48 hours prior to surgery. Men may shave face and neck.  Do not bring valuables to the hospital.    [facility] is not responsible for any belongings or valuables.               Contacts, dentures or bridgework may not be worn into surgery.  Leave your suitcase in the car. After surgery it may be brought to your room.  For patients admitted to the hospital, discharge time is determined by your                       treatment  team.   Patients discharged the day of surgery will not be allowed to drive home.  You will need someone to drive you home and stay with you the night of your procedure.    Please read over the following fact sheets that you were given:   [facility] Preparing for Surgery and or MRSA Information   ____ Take anti-hypertensive listed below, cardiac, seizure, asthma, anti-reflux and psychiatric medicines. These include:  1. NONE  2.  3.  4.  5.  6.  ____Fleets enema or Magnesium Citrate as directed.   ____ Use CHG Soap or sage wipes as directed on instruction sheet   ____ Use inhalers on the day of surgery and bring to hospital day of surgery  ____ Stop Metformin and Janumet 2 days prior to surgery.    ____ Take 1/2 of usual insulin dose the night before surgery and none on the morning surgery.   ____ Follow recommendations from Cardiologist, Pulmonologist or PCP regarding stopping Aspirin, Coumadin, Plavix ,Eliquis, Effient, or Pradaxa, and Pletal.  X____Stop Anti-inflammatories such as Advil, Aleve, Ibuprofen, Motrin, Naproxen, Naprosyn, Goodies powders or aspirin products 7 DAYS PRIOR TO SURGERY-OK to take Tylenol    _x___ Stop supplements  until after surgery-STOP CO Q 10, FISH OIL, HAIR SKIN AND NAILS, AND MSM 7 DAYS PRIOR TO SURGERY-MAY RESUME AFTER SURGERY   ____ Bring C-Pap to the hospital.

## 2017-08-28 NOTE — Telephone Encounter (Signed)
She had questions regarding the need for a catheter and self-catheterization postop.  Based on her stricture she was informed that she should not require either.

## 2017-09-15 ENCOUNTER — Ambulatory Visit: Payer: No Typology Code available for payment source | Admitting: Anesthesiology

## 2017-09-15 ENCOUNTER — Ambulatory Visit
Admission: RE | Admit: 2017-09-15 | Discharge: 2017-09-15 | Disposition: A | Discharge status: Home / Self Care | Payer: No Typology Code available for payment source | Source: Ambulatory Visit | Attending: Urology | Admitting: Urology

## 2017-09-15 ENCOUNTER — Encounter: Payer: Self-pay | Admitting: Anesthesiology

## 2017-09-15 ENCOUNTER — Encounter
Admission: RE | Disposition: A | Discharge status: Home / Self Care | Payer: Self-pay | Source: Ambulatory Visit | Attending: Urology

## 2017-09-15 DIAGNOSIS — K219 Gastro-esophageal reflux disease without esophagitis: Secondary | ICD-10-CM | POA: Insufficient documentation

## 2017-09-15 DIAGNOSIS — N3592 Unspecified urethral stricture, female: Secondary | ICD-10-CM | POA: Diagnosis not present

## 2017-09-15 DIAGNOSIS — N3582 Other urethral stricture, female: Secondary | ICD-10-CM

## 2017-09-15 DIAGNOSIS — Z853 Personal history of malignant neoplasm of breast: Secondary | ICD-10-CM | POA: Insufficient documentation

## 2017-09-15 DIAGNOSIS — M1990 Unspecified osteoarthritis, unspecified site: Secondary | ICD-10-CM | POA: Diagnosis not present

## 2017-09-15 DIAGNOSIS — N368 Other specified disorders of urethra: Secondary | ICD-10-CM | POA: Insufficient documentation

## 2017-09-15 HISTORY — PX: CYSTOSCOPY WITH URETHRAL DILATATION: SHX5125

## 2017-09-15 LAB — POCT PREGNANCY, URINE: Preg Test, Ur: NEGATIVE

## 2017-09-15 SURGERY — CYSTOSCOPY, WITH URETHRAL DILATION
Anesthesia: General | Wound class: Clean Contaminated

## 2017-09-15 MED ORDER — PROPOFOL 10 MG/ML IV BOLUS
INTRAVENOUS | Status: DC | PRN
  Administered 2017-09-15: 60 mg via INTRAVENOUS
  Administered 2017-09-15: 140 mg via INTRAVENOUS

## 2017-09-15 MED ORDER — MIDAZOLAM HCL 2 MG/2ML IJ SOLN
INTRAMUSCULAR | Status: AC
  Filled 2017-09-15: qty 2

## 2017-09-15 MED ORDER — PHENYLEPHRINE HCL 10 MG/ML IJ SOLN
INTRAMUSCULAR | Status: AC
  Filled 2017-09-15: qty 1

## 2017-09-15 MED ORDER — ONDANSETRON HCL 4 MG/2ML IJ SOLN
INTRAMUSCULAR | Status: AC
  Filled 2017-09-15: qty 2

## 2017-09-15 MED ORDER — LACTATED RINGERS IV SOLN
INTRAVENOUS | Status: DC
  Administered 2017-09-15 (×2): via INTRAVENOUS

## 2017-09-15 MED ORDER — ONDANSETRON HCL 4 MG/2ML IJ SOLN: 4.0000 mg | Freq: Once | INTRAMUSCULAR | Status: DC | PRN

## 2017-09-15 MED ORDER — FENTANYL CITRATE (PF) 100 MCG/2ML IJ SOLN
INTRAMUSCULAR | Status: DC | PRN
  Administered 2017-09-15 (×2): 50 ug via INTRAVENOUS

## 2017-09-15 MED ORDER — PROPOFOL 10 MG/ML IV BOLUS
INTRAVENOUS | Status: AC
  Filled 2017-09-15: qty 20

## 2017-09-15 MED ORDER — LIDOCAINE HCL (PF) 2 % IJ SOLN
INTRAMUSCULAR | Status: AC
  Filled 2017-09-15: qty 10

## 2017-09-15 MED ORDER — FENTANYL CITRATE (PF) 100 MCG/2ML IJ SOLN
INTRAMUSCULAR | Status: AC
  Filled 2017-09-15: qty 2

## 2017-09-15 MED ORDER — DEXAMETHASONE SODIUM PHOSPHATE 10 MG/ML IJ SOLN
INTRAMUSCULAR | Status: DC | PRN
  Administered 2017-09-15: 10 mg via INTRAVENOUS

## 2017-09-15 MED ORDER — CEFAZOLIN SODIUM-DEXTROSE 2-4 GM/100ML-% IV SOLN
INTRAVENOUS | Status: AC
  Filled 2017-09-15: qty 100

## 2017-09-15 MED ORDER — PHENAZOPYRIDINE HCL 200 MG PO TABS: 200.0000 mg | ORAL_TABLET | Freq: Three times a day (TID) | ORAL | 0 refills | Status: DC | PRN

## 2017-09-15 MED ORDER — FAMOTIDINE 20 MG PO TABS
20.0000 mg | ORAL_TABLET | Freq: Once | ORAL | Status: AC
  Administered 2017-09-15: 20 mg via ORAL

## 2017-09-15 MED ORDER — PHENYLEPHRINE HCL 10 MG/ML IJ SOLN
INTRAMUSCULAR | Status: DC | PRN
  Administered 2017-09-15 (×2): 200 ug via INTRAVENOUS

## 2017-09-15 MED ORDER — MIDAZOLAM HCL 2 MG/2ML IJ SOLN
INTRAMUSCULAR | Status: DC | PRN
  Administered 2017-09-15: 2 mg via INTRAVENOUS

## 2017-09-15 MED ORDER — DEXAMETHASONE SODIUM PHOSPHATE 10 MG/ML IJ SOLN
INTRAMUSCULAR | Status: AC
  Filled 2017-09-15: qty 1

## 2017-09-15 MED ORDER — FAMOTIDINE 20 MG PO TABS
ORAL_TABLET | ORAL | Status: AC
  Administered 2017-09-15: 20 mg via ORAL
  Filled 2017-09-15: qty 1

## 2017-09-15 MED ORDER — LIDOCAINE HCL (CARDIAC) 20 MG/ML IV SOLN
INTRAVENOUS | Status: DC | PRN
  Administered 2017-09-15: 80 mg via INTRAVENOUS

## 2017-09-15 MED ORDER — FENTANYL CITRATE (PF) 100 MCG/2ML IJ SOLN: 25.0000 ug | INTRAMUSCULAR | Status: DC | PRN

## 2017-09-15 MED ORDER — CEFAZOLIN SODIUM-DEXTROSE 2-4 GM/100ML-% IV SOLN
2.0000 g | Freq: Once | INTRAVENOUS | Status: AC
  Administered 2017-09-15: 2 g via INTRAVENOUS

## 2017-09-15 MED ORDER — ONDANSETRON HCL 4 MG/2ML IJ SOLN
INTRAMUSCULAR | Status: DC | PRN
  Administered 2017-09-15: 4 mg via INTRAVENOUS

## 2017-09-15 SURGICAL SUPPLY — 12 items
CATH FOL 2WAY LX 16X5 (CATHETERS) IMPLANT
CATH FOLEY 2W COUNCIL 20FR 5CC (CATHETERS) IMPLANT
CATH SET URETHRAL DILATOR (CATHETERS) IMPLANT
ELECT REM PT RETURN 9FT ADLT (ELECTROSURGICAL)
ELECTRODE REM PT RTRN 9FT ADLT (ELECTROSURGICAL) IMPLANT
GLOVE BIOGEL M 8.0 STRL (GLOVE) ×2 IMPLANT
GOWN STANDARD XL  REUSABL (MISCELLANEOUS) ×4 IMPLANT
PACK CYSTO AR (MISCELLANEOUS) ×2 IMPLANT
SET CYSTO W/LG BORE CLAMP LF (SET/KITS/TRAYS/PACK) ×2 IMPLANT
SYR 30ML LL (SYRINGE) IMPLANT
WATER STERILE IRR 1000ML POUR (IV SOLUTION) ×2 IMPLANT
WATER STERILE IRR 3000ML UROMA (IV SOLUTION) ×2 IMPLANT

## 2017-09-15 NOTE — Op Note (Signed)
Date of procedure: 09/15/17  Preoperative diagnosis:  1. Urethral stricture  Postoperative diagnosis:  1. Urethral stricture  Procedure: 1. Cystoscopy with dilation of urethral stricture  Surgeon: John Giovanni, MD  Anesthesia: General  Complications: None  Intraoperative findings: Thin stricture mid urethra calibrating to 12 French  EBL: None  Specimens: None  Drains: None  Indication: Beth Branch is a 51 y.o. patient with history of urethral spotting and mild pelvic discomfort.  Cystoscopy in office unable to perform secondary to a stricture in the mid urethra.  After reviewing the management options for treatment, he elected to proceed with the above surgical procedure(s). We have discussed the potential benefits and risks of the procedure, side effects of the proposed treatment, the likelihood of the patient achieving the goals of the procedure, and any potential problems that might occur during the procedure or recuperation. Informed consent has been obtained.  Description of procedure:  The patient was taken to the operating room and general anesthesia was induced.  The patient was placed in the dorsal lithotomy position, prepped and draped in the usual sterile fashion, and preoperative antibiotics were administered. A preoperative time-out was performed.   A 10 French urethral bougie was lubricated and easily placed per urethra into the bladder.  There was slight pain with a 12 French bougie.  The urethra was dilated with Elby Showers sounds from 12-22 [place] without difficulty.  A cystoscopy sheath with obturator was lubricated and easily passed per urethra.  Panendoscopy was performed and the bladder mucosa showed no erythema, solid or papillary lesions.  Findings consistent with a small mucosal hemangioma noted in the dome of the bladder.  The ureteral orifices were normal appearing with clear efflux.  The urethra was examined and no bleeding or lesions were noted.  The bladder  was emptied and the cystoscope was removed.  After anesthetic reversal the patient was transported to the PACU in stable condition.   John Giovanni, M.D.

## 2017-09-15 NOTE — Discharge Instructions (Signed)

## 2017-09-15 NOTE — Anesthesia Preprocedure Evaluation (Signed)
Anesthesia Evaluation  Patient identified by MRN, date of birth, ID band Patient awake    Reviewed: Allergy & Precautions, NPO status , Patient's Chart, lab work & pertinent test results, reviewed documented beta blocker date and time   History of Anesthesia Complications (+) Family history of anesthesia reaction and history of anesthetic complications  Airway Mallampati: II  TM Distance: >3 FB     Dental  (+) Chipped   Pulmonary neg pulmonary ROS,           Cardiovascular      Neuro/Psych negative neurological ROS  negative psych ROS   GI/Hepatic GERD  Controlled,  Endo/Other  negative endocrine ROS  Renal/GU negative Renal ROS     Musculoskeletal  (+) Arthritis , Osteoarthritis,    Abdominal   Peds  Hematology negative hematology ROS (+)   Anesthesia Other Findings Past Medical History: No date: Adopted 04-05-10: BRCA negative 2010: Breast cancer ([place])     Comment:  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 S/p excision by [place]               General 12/18/2015. No date: Complication of anesthesia     Comment:  REFLUX AFTER ANESTHESIA 09/28/2012: Cyst of tendon sheath     Comment:  Overview:  L hand, along the extensor tendons of the               long and ring fingers ->[facility] 09/2012. Opted to               observe.  11/16/2016: Degenerative disc disease, cervical     Comment:  Overview:  Had neck pain ->>Plain films 09/2015               (referred to PT).  10/11/2013: Dysmenorrhea No date: Family history of adverse reaction to anesthesia     Comment:  adopted 10/11/2013: Fibroids, intramural No date: GERD (gastroesophageal reflux disease)     Comment:  occ- no meds 08/13/2017: Hemangioma     Comment:  Overview:  cavernous hemangioma abdominal wall- removed  2011: History of blood clots     Comment:   LEFT ARM-AFTER SURGERY 09/11/2015: Intramural leiomyoma of uterus     Comment:  Overview:  Pelvic US 07/2015. Followed by Dr. Kenton Kingfisher -               OBGYN.  12/26/2013: Keloid of skin 11/16/2016: Liver cyst     Comment:  Overview:  Has small benign appearing liver cysts on CT               A/P + MRI Liver (2017). Had consult w/ Biliary at [facility] and found to also have a benign hemangioma.  08/20/2015: Pap smear abnormality of cervix with ASCUS favoring benign 12/12/2013: Postinflammatory hyperpigmentation 03/01/2014: Seborrheic dermatitis  Reproductive/Obstetrics                             Anesthesia Physical  Anesthesia Plan  ASA: II  Anesthesia Plan: General   Post-op Pain Management:    Induction: Intravenous  PONV Risk Score and Plan:   Airway Management Planned: LMA  Additional Equipment:   Intra-op Plan:   Post-operative Plan: Extubation in OR  Informed Consent: I have reviewed the patients History and Physical,  chart, labs and discussed the procedure including the risks, benefits and alternatives for the proposed anesthesia with the patient or authorized representative who has indicated his/her understanding and acceptance.     Plan Discussed with: CRNA and Surgeon  Anesthesia Plan Comments:         Anesthesia Quick Evaluation

## 2017-09-15 NOTE — Transfer of Care (Signed)
Immediate Anesthesia Transfer of Care Note  Patient: Beth Branch  Procedure(s) Performed: CYSTOSCOPY WITH URETHRAL DILATATION (N/A )  Patient Location: PACU  Anesthesia Type:General  Level of Consciousness: drowsy and patient cooperative  Airway & Oxygen Therapy: Patient Spontanous Breathing and Patient connected to face mask oxygen  Post-op Assessment: Report given to RN and Post -op Vital signs reviewed and stable  Post vital signs: Reviewed and stable  Last Vitals:  Vitals:   09/15/17 0622  BP: 111/70  Pulse: 90  Resp: 18  Temp: (!) 36 C  SpO2: 99%    Last Pain:  Vitals:   09/15/17 0622  TempSrc: Tympanic         Complications: No apparent anesthesia complications

## 2017-09-15 NOTE — Anesthesia Procedure Notes (Signed)
Procedure Name: LMA Insertion Date/Time: 09/15/2017 7:41 AM Performed by: Silvana Newness, CRNA Pre-anesthesia Checklist: Patient identified, Emergency Drugs available, Suction available and Patient being monitored Patient Re-evaluated:Patient Re-evaluated prior to induction Oxygen Delivery Method: Circle system utilized Preoxygenation: Pre-oxygenation with 100% oxygen Induction Type: IV induction Ventilation: Mask ventilation without difficulty LMA: LMA inserted LMA Size: 4.0 Number of attempts: 1 Placement Confirmation: positive ETCO2 and breath sounds checked- equal and bilateral Tube secured with: Tape Dental Injury: Teeth and Oropharynx as per pre-operative assessment

## 2017-09-15 NOTE — Anesthesia Postprocedure Evaluation (Signed)
Anesthesia Post Note  Patient: Beth Branch  Procedure(s) Performed: CYSTOSCOPY WITH URETHRAL DILATATION (N/A )  Patient location during evaluation: PACU Anesthesia Type: General Level of consciousness: awake and alert and oriented Pain management: pain level controlled Vital Signs Assessment: post-procedure vital signs reviewed and stable Respiratory status: spontaneous breathing Cardiovascular status: blood pressure returned to baseline Anesthetic complications: no     Last Vitals:  Vitals:   09/15/17 0854 09/15/17 0903  BP: 108/66 107/65  Pulse: 71 73  Resp: 19 16  Temp: 37.1 C 36.9 C  SpO2: 100% 100%    Last Pain:  Vitals:   09/15/17 0903  TempSrc: Temporal  PainSc:                  Kendi Defalco

## 2017-09-15 NOTE — Anesthesia Post-op Follow-up Note (Signed)
Anesthesia QCDR form completed.        

## 2017-09-15 NOTE — Interval H&P Note (Signed)
History and Physical Interval Note:  09/15/2017 7:12 AM  Beth Branch  has presented today for surgery, with the diagnosis of urethral stricture  The various methods of treatment have been discussed with the patient and family. After consideration of risks, benefits and other options for treatment, the patient has consented to  Procedure(s): CYSTOSCOPY WITH URETHRAL DILATATION (N/A) as a surgical intervention .  The patient's history has been reviewed, patient examined, no change in status, stable for surgery.  I have reviewed the patient's chart and labs.  Questions were answered to the patient's satisfaction.    [place]

## 2017-10-08 DIAGNOSIS — E559 Vitamin D deficiency, unspecified: Secondary | ICD-10-CM | POA: Insufficient documentation

## 2017-10-14 DIAGNOSIS — Z87448 Personal history of other diseases of urinary system: Secondary | ICD-10-CM | POA: Insufficient documentation

## 2017-10-15 ENCOUNTER — Encounter: Payer: Self-pay | Admitting: Urology

## 2017-10-15 ENCOUNTER — Ambulatory Visit (INDEPENDENT_AMBULATORY_CARE_PROVIDER_SITE_OTHER): Payer: 59 | Admitting: Urology

## 2017-10-15 VITALS — BP 111/73 | HR 87 | Ht 67.0 in | Wt 187.0 lb

## 2017-10-15 DIAGNOSIS — R339 Retention of urine, unspecified: Secondary | ICD-10-CM

## 2017-10-15 DIAGNOSIS — R350 Frequency of micturition: Secondary | ICD-10-CM

## 2017-10-15 LAB — BLADDER SCAN AMB NON-IMAGING: Scan Result: 200

## 2017-10-15 NOTE — Progress Notes (Signed)
10/15/2017 2:38 PM   Beth Branch 11-12-1965 [id number]  Referring provider: Maryruth Hancock, MD [address] [place], [place] [address]  Chief Complaint  Patient presents with  . Post-op Follow-up    HPI: 51 year old female presents for postop follow-up.  She was initially seen in October 2018 for urethral spotting which occurred after a hysteroscopy in May 2018.  She also had urinary frequency and dull suprapubic pressure.  PVR by bladder scan at that visit was 204 mL.  She returned on 08/18/2017 for office cystoscopy however this was unsuccessful secondary to a strictured area in the mid urethra which would not allow passage of the cystoscope.  She underwent cystoscopy with urethral dilation on 09/15/2017.  The thin stricture was calibrated to 12 [place].  Postoperatively she has done well.  She feels she is voiding with a better stream.  She does have urinary frequency.  She denies recurrent bleeding.   PMH: Past Medical History:  Diagnosis Date  . Adopted   . BRCA negative 04-05-10  . 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 S/p excision by  Branch 12/18/2015.  Marland Kitchen Complication of anesthesia    REFLUX AFTER ANESTHESIA  . Cyst of tendon sheath 09/28/2012   Overview:  L hand, along the extensor tendons of the long and ring fingers ->[facility] 09/2012. Opted to observe.   . Degenerative disc disease, cervical 11/16/2016   Overview:  Had neck pain ->>Plain films 09/2015 (referred to PT).   . Dysmenorrhea 10/11/2013  . Family history of adverse reaction to anesthesia    adopted  . Fibroids, intramural 10/11/2013  . GERD (gastroesophageal reflux disease)    occ- no meds  . Hemangioma 08/13/2017   Overview:  cavernous hemangioma abdominal wall- removed   . History of blood clots 2011   LEFT ARM-AFTER SURGERY  . Intramural leiomyoma of uterus  09/11/2015   Overview:  Pelvic US 07/2015. Followed by Dr. Kenton Kingfisher - OBGYN.   Marland Kitchen Keloid of skin 12/26/2013  . Liver cyst 11/16/2016   Overview:  Has small benign appearing liver cysts on CT A/P + MRI Liver (2017). Had consult w/ Biliary at [facility] and found to also have a benign hemangioma.   . Pap smear abnormality of cervix with ASCUS favoring benign 08/20/2015  . Postinflammatory hyperpigmentation 12/12/2013  . Seborrheic dermatitis 03/01/2014    Surgical History: Past Surgical History:  Procedure Laterality Date  . BREAST RECONSTRUCTION Bilateral 2011   Dr Greta Doom in [place]    . COMPLETE MASTECTOMY W/ SENTINEL NODE BIOPSY Bilateral 2010   Right breast DCIS  . CYSTOSCOPY WITH URETHRAL DILATATION N/A 09/15/2017   Procedure: CYSTOSCOPY WITH URETHRAL DILATATION;  Surgeon: Abbie Sons, MD;  Location: [facility] ORS;  Service: Urology;  Laterality: N/A;  . EXCISION MASS ABDOMINAL N/A 12/26/2015   EXCISION MASS ABDOMINAL; CAVERNOUS HEMANGIOMA Seeplaputhur Robinette Haines, MD;  Location: [facility] ORS;  Service: Branch;  Laterality: N/A;  . KNEE ARTHROSCOPY Right     Home Medications:  Allergies as of 10/15/2017   No Known Allergies     Medication List        Accurate as of 10/15/17  2:38 PM. Always use your most recent med list.          b complex vitamins capsule Take 1 capsule by mouth daily.   cholecalciferol 1000 units tablet  Commonly known as:  VITAMIN D Take 1,000 Units by mouth daily.   CO Q 10 PO Take 1 tablet by mouth daily.   HAIR SKIN AND NAILS FORMULA PO Take 1 tablet by mouth daily.   ibuprofen 800 MG tablet Commonly known as:  ADVIL,MOTRIN Take 1 tablet (800 mg total) by mouth every 8 (eight) hours as needed for mild pain or moderate pain.   KP FISH OIL 1200 MG Caps Take 1 capsule by mouth daily.   MAGNESIUM PO Take 1 tablet daily by mouth. ONLY TAKES WHILE ON HER PERIOD   MSM PO Take 1 tablet every morning by mouth.   PROBIOTIC PO Take 1  capsule by mouth daily.       Allergies: No Known Allergies  Family History: Family History  Adopted: Yes  Problem Relation Age of Onset  . Prostate cancer Neg Hx   . Bladder Cancer Neg Hx   . Kidney cancer Neg Hx     Social History:  reports that  has never smoked. she has never used smokeless tobacco. She reports that she does not drink alcohol or use drugs.  ROS: UROLOGY Frequent Urination?: Yes Hard to postpone urination?: No Burning/pain with urination?: No Get up at night to urinate?: No Leakage of urine?: No Urine stream starts and stops?: Yes Trouble starting stream?: No Do you have to strain to urinate?: No Blood in urine?: No Urinary tract infection?: No Sexually transmitted disease?: No Injury to kidneys or bladder?: No Painful intercourse?: No Weak stream?: No Currently pregnant?: No Vaginal bleeding?: No Last menstrual period?: n  Gastrointestinal Nausea?: No Vomiting?: No Indigestion/heartburn?: No Diarrhea?: No Constipation?: No  Constitutional Fever: No Night sweats?: No Weight loss?: No Fatigue?: No  Skin Skin rash/lesions?: No Itching?: No  Eyes Blurred vision?: No Double vision?: No  Ears/Nose/Throat Sore throat?: No Sinus problems?: No  Hematologic/Lymphatic Swollen glands?: Yes Easy bruising?: No  Cardiovascular Leg swelling?: No Chest pain?: No  Respiratory Cough?: No Shortness of breath?: No  Endocrine Excessive thirst?: No  Musculoskeletal Back pain?: No Joint pain?: No  Neurological Headaches?: No Dizziness?: No  Psychologic Depression?: No Anxiety?: No  Physical Exam: BP 111/73   Pulse 87   Ht 5' 7" (1.702 m)   Wt 187 lb (84.8 kg)   BMI 29.29 kg/m    Constitutional:  Alert and oriented, No acute distress. HEENT: [place] AT, moist mucus membranes.  Trachea midline, no masses. Cardiovascular: No clubbing, cyanosis, or edema. Respiratory: Normal respiratory effort, no increased work of  breathing. GI: Abdomen is soft, nontender, nondistended, no abdominal masses GU: No CVA tenderness.   Skin: No rashes, bruises or suspicious lesions. Lymph: No cervical or inguinal adenopathy. Neurologic: Grossly intact, no focal deficits, moving all 4 extremities. Psychiatric: Normal mood and affect.  Laboratory Data: Lab Results  Component Value Date   WBC 7.0 12/16/2012   HGB 13.4 12/16/2012   HCT 39.2 12/16/2012   MCV 93.1 12/16/2012   PLT 287 12/16/2012    Lab Results  Component Value Date   CREATININE 0.79 12/16/2012      Assessment & Plan:   1. Urinary frequency She has persistent urinary frequency and follow-up PVR by bladder scan today was 200 mL.  Have recommended scheduling a urodynamic study at [facility] in [place].   Return in about 6 months (around 04/15/2018) for Recheck.  Abbie Sons, [place] [address], [place], [place] [address] [phone]

## 2017-10-16 ENCOUNTER — Telehealth: Payer: Self-pay | Admitting: Urology

## 2017-10-16 NOTE — Telephone Encounter (Signed)
Patient called asking for a return call from you. She states that she was told during her PVR yesterday that he bladder was pressing down on her organs and that was concerning to her. She wants to discuss this with you. She wants a call back at [place]

## 2017-10-22 NOTE — Telephone Encounter (Signed)
Please let pt know I am oot of office. There is no concern of "bladder presing on organs".

## 2017-10-23 ENCOUNTER — Other Ambulatory Visit: Payer: Self-pay

## 2017-10-23 ENCOUNTER — Ambulatory Visit: Payer: No Typology Code available for payment source | Attending: Urology

## 2017-10-23 DIAGNOSIS — R293 Abnormal posture: Secondary | ICD-10-CM | POA: Diagnosis present

## 2017-10-23 DIAGNOSIS — M62838 Other muscle spasm: Secondary | ICD-10-CM | POA: Diagnosis not present

## 2017-10-23 DIAGNOSIS — R350 Frequency of micturition: Secondary | ICD-10-CM | POA: Diagnosis present

## 2017-10-23 DIAGNOSIS — M629 Disorder of muscle, unspecified: Secondary | ICD-10-CM | POA: Insufficient documentation

## 2017-10-23 DIAGNOSIS — R339 Retention of urine, unspecified: Secondary | ICD-10-CM

## 2017-10-23 DIAGNOSIS — M6289 Other specified disorders of muscle: Secondary | ICD-10-CM

## 2017-10-23 NOTE — Therapy (Signed)
[place] MAIN [facility] SERVICES [address] [place], [place], [address] Phone: [phone]   Fax:  [phone]  Physical Therapy Evaluation  Patient Details  Name: Beth Branch MRN: [id number] Date of Birth: 20-Oct-1966 No Data Recorded  Encounter Date: 10/23/2017  PT End of Session - 10/23/17 1148    Visit Number  1    Number of Visits  12    Date for PT Re-Evaluation  01/15/18    PT Start Time  0930    PT Stop Time  1110    PT Time Calculation (min)  100 min    Activity Tolerance  Patient tolerated treatment well    Behavior During Therapy  [facility] for tasks assessed/performed       Past Medical History:  Diagnosis Date  . Adopted   . BRCA negative 04-05-10  . 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 S/p excision by [facility] 12/18/2015.  Marland Kitchen Complication of anesthesia    REFLUX AFTER ANESTHESIA  . Cyst of tendon sheath 09/28/2012   Overview:  L hand, along the extensor tendons of the long and ring fingers ->[facility] 09/2012. Opted to observe.   . Degenerative disc disease, cervical 11/16/2016   Overview:  Had neck pain ->>Plain films 09/2015 (referred to PT).   . Dysmenorrhea 10/11/2013  . Family history of adverse reaction to anesthesia    adopted  . Fibroids, intramural 10/11/2013  . GERD (gastroesophageal reflux disease)    occ- no meds  . Hemangioma 08/13/2017   Overview:  cavernous hemangioma abdominal wall- removed   . History of blood clots 2011   LEFT ARM-AFTER SURGERY  . Intramural leiomyoma of uterus 09/11/2015   Overview:  Pelvic US 07/2015. Followed by Dr. Kenton Kingfisher - OBGYN.   Marland Kitchen Keloid of skin 12/26/2013  . Liver cyst 11/16/2016   Overview:  Has small benign appearing liver cysts on CT A/P + MRI Liver (2017). Had consult w/ Biliary at [facility] and found to also have a benign hemangioma.   . Pap smear  abnormality of cervix with ASCUS favoring benign 08/20/2015  . Postinflammatory hyperpigmentation 12/12/2013  . Seborrheic dermatitis 03/01/2014    Past Surgical History:  Procedure Laterality Date  . BREAST RECONSTRUCTION Bilateral 2011   Dr Greta Doom in [place]    . COMPLETE MASTECTOMY W/ SENTINEL NODE BIOPSY Bilateral 2010   Right breast DCIS  . CYSTOSCOPY WITH URETHRAL DILATATION N/A 09/15/2017   Procedure: CYSTOSCOPY WITH URETHRAL DILATATION;  Surgeon: Abbie Sons, MD;  Location: [facility] ORS;  Service: Urology;  Laterality: N/A;  . EXCISION MASS ABDOMINAL N/A 12/26/2015   EXCISION MASS ABDOMINAL; CAVERNOUS HEMANGIOMA Seeplaputhur Robinette Haines, MD;  Location: [facility] ORS;  Service: General;  Laterality: N/A;  . KNEE ARTHROSCOPY Right     There were no vitals filed for this visit.       [facility] PT Assessment - 10/23/17 0001      Assessment   Medical Diagnosis  Urinary Frequency    Referring Provider  Stoioff, Scott  (Pended)     Onset Date/Surgical Date  12/21/16  Roni Bread)     Next MD Visit  Dec 04 2017  (Pended)     Prior Therapy  urethral dilation  (Pended)       Precautions   Precautions  None  (Pended)  Restrictions   Weight Bearing Restrictions  No  (Pended)       Balance Screen   Has the patient fallen in the past 6 months  No    Has the patient had a decrease in activity level because of a fear of falling?   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 church sing, read, dogs        Pelvic Floor Physical Therapy Evaluation and Assessment  SCREENING    Patient's communication preference: phone call,  voicemail  Red Flags:  Have you had any night sweats? no Unexplained weight loss? no Saddle anesthesia? no Unexplained changes in bowel or bladder habits? no  SUBJECTIVE  Patient reports: Has an enlarged lymph node in the R hip that causes nagging discomfort and radiating pain in R leg. Denies swelling in hands and feet. States she has some lymph nodes in her L armpit region that can be sore but the doctor is not concerned. Onset of bladder issues around March 2018. Feels that she was having post-void residual as first symptom. Had a [facility]  And biopsy in February 2018 and immediately had a bad UTI and yeast infection following the procedure and strted feeling pressure in the cpelvic region that was unexplained for months following this. She started having increased frequency at this point. Had two instances of hematuria at home that was unconfirmed by tests. Stricture found and dilated but continued to have urinary frequency. Also has mild discomfort that can be achy in the hip fold area on either side.     Recent Procedures/Tests/Findings:  Urodynamic study  Obstetrical History: None, 1 pregnancies   Gynecological History: endiomyosis causing painful periods, mild cervical dysplasia   Urinary History: Does not have difficulty beginning stream or have feeling of incomplete emptying but study reveals high residual volume.  Is urinating every ~ 1 hour. Sometimes has had mild pain with urination.  Gastrointestinal History: Solid but not hard stool, occasional fluctuation due to diet.  Sexual activity/pain: Some history of pain but believes this is possibly due to endiomyosis (endometriosis in the muscle)  Location of pain: groin, R ant. Hip. Current pain:  1/10  Max pain: 3 /10 Least pain:  0/10 Nature of pain: dull achy  Patient Goals: To help empty her bladder and decrease frequency.    OBJECTIVE  Posture/Observations:  Sitting: increased lordosis Standing: increased  lordosis  Range of Motion/Flexibilty: deferred to next visit Spine:  Hips:   Strength/MMT: deferred to next visit LE MMT  LE MMT Left Right  Hip flex:  (L2) /5 /5  Hip ext: /5 /5  Hip abd: /5 /5  Hip add: /5 /5  Hip IR /5 /5  Hip ER /5 /5     Abdominal:  Palpation: TTP through psoas on R>L, full palpation not performed Diastasis: not assessed  Pelvic Floor External Exam: Introitus Appears: elevated Skin integrity: intact Palpation: TTP through STP B mildly Cough: intact Prolapse visible?: no Scar mobility: N/A  Internal Vaginal Exam: Strength (  PERF): 5, 10, 3 Symmetry: good Palpation: TTP through all PFM B Prolapse: none    Gait Analysis: deferred to next visit   Pelvic Floor Outcome Measures: UDI: 44/100, UIQ: 57/100  Interventions this session:  Self Care: patient educated on the structure and function of the pelvic floor in relation to her symptoms, use of a squatty potty to improve ability to relax the PFM for bladder emptying and bowel movements. Educated on Diaphragmatic breathing for decreased stress and pelvic floor relaxation, and educated on importance of addressing underlying stress in her recovery, provided a reference for finding a psychotherapist.  Total Time: 100 min.         Objective measurements completed on examination: See above findings.              PT Education - 10/23/17 1147    Education Details  educated on use of a squatty potty to improve ability to relax the PFM for bladder emptying and bowel movements. Educated on Diaphragmatic breathing for decreased stress and pelvic floor relaxation, and educated on importance of addressing underlying stress in her recovery, provided a reference for finding a psychotherapist. Educated on structure and function of the pelvic floor, exam findings, HEP, and POC.    Person(s) Educated  Patient    Methods  Explanation    Comprehension  Verbalized understanding       PT Short Term  Goals - 10/23/17 1158      PT SHORT TERM GOAL #1   Title  Patient will consistently use squatty-potty at home to decrease resistance for urination and allow more complete emptying of bowel/bladder    Time  5    Period  Weeks    Status  New    Target Date  11/27/17      PT SHORT TERM GOAL #2   Title  Patient will demonstrate improved sitting and standing posture to demonstrate learning and decrease stress on the pelvic floor with functional activity.    Time  5    Period  Weeks    Status  New    Target Date  11/27/17      PT SHORT TERM GOAL #3   Title  Patient will report no feeling of discomfort/pain in the R hip and thigh to demonstrate decreased PFM tension and improved posture.    Time  8    Period  Weeks    Status  New    Target Date  12/18/17        PT Long Term Goals - 10/23/17 1202      PT LONG TERM GOAL #1   Title  Patient will report being able to regulary go 3 hours between trips to urinate to demonstrate decreased urinary frequency and more complete emptying.    Time  12    Period  Weeks    Status  New    Target Date  01/15/18      PT LONG TERM GOAL #2   Title  Patient will demonstrate appropriate body mechanics with bending and lifting heavy objects to allow for decreased stress on the pelvic floor and low back.     Time  12    Period  Weeks    Status  New    Target Date  01/15/18      PT LONG TERM GOAL #3   Title  Patient will score at or below 14/100 on the PFDI and 27/100 on the UIQ to demonstrate a clinically meaningful decrease in disability  and distress due to pelvic floor dysfunction.    Baseline  UDI: 44/100 UIQ:57/100     Time  12    Period  Weeks    Status  New    Target Date  01/15/18             Plan - 10/23/17 1150    Clinical Impression Statement  Patient is a 52 y/o fe,ale who presents with cheif c/o incomplete bladder emptying and urinary frequency. Exam findings and history demonstrate a high resting pelvic floor, PFM spasms, and  increased lordotic posture that are contributing to her symptoms along with a 5 year history of constant high-stress situations. Patient will benefit from skilled pelvic floor PT to address her muscular imbalances, spasms, and posture to allow for improved bladder emptying and to prevent future injury or symptom return.    History and Personal Factors relevant to plan of care:  history of breast cancer and 5 years of high stress surrounding losing both parents and medical issues.    Clinical Presentation  Stable    Clinical Decision Making  Moderate    Rehab Potential  Good    Clinical Impairments Affecting Rehab Potential  history of cancer, longstanding high stress    PT Frequency  1x / week    PT Duration  12 weeks    PT Treatment/Interventions  ADLs/Self Care Home Management;Biofeedback;Moist Heat;Traction;Functional mobility training;Gait training;Therapeutic activities;Therapeutic exercise;Balance training;Neuromuscular re-education;Manual techniques;Scar mobilization;Passive range of motion;Dry needling;Taping    PT Next Visit Plan  assess hip rom/strength and spine mobility    PT Home Exercise Plan  Diaphragmatic breathing, squatty potty    Recommended Other Services  counselling for stress/greif    Consulted and Agree with Plan of Care  Patient       Patient will benefit from skilled therapeutic intervention in order to improve the following deficits and impairments:  Abnormal gait, Increased fascial restricitons, Improper body mechanics, Pain, Decreased scar mobility, Increased muscle spasms, Postural dysfunction, Impaired flexibility, Other (comment)(urinary frequency and swolen lymph nodes in armpit and groin)  Visit Diagnosis: Other muscle spasm  Abnormal posture  Muscular imbalance  Incomplete bladder emptying  Urinary frequency     Problem List Patient Active Problem List   Diagnosis Date Noted  . Urinary frequency 08/14/2017  . Incomplete bladder emptying  08/14/2017  . Urethral bleeding 08/14/2017  . Hemangioma 08/13/2017  . 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 Right hip pain 04/24/2017  . Atypical glandular cells of undetermined significance (AGUS) on cervical Pap smear 02/23/2017  . Degenerative disc disease, cervical 11/16/2016  . Liver cyst 11/16/2016  . Acute pain of left knee 11/11/2016  . 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 History of abnormal cervical Pap smear 09/14/2015  . History of breast cancer 09/11/2015  . Intramural leiomyoma of uterus 09/11/2015  . 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 Pap smear abnormality of cervix with ASCUS favoring benign 08/20/2015  . Vaginal discharge 08/20/2015  . Abscess of arm, right 03/01/2014  . Seborrheic dermatitis 03/01/2014  . Keloid of skin 12/26/2013  . Acne 12/12/2013  . Postinflammatory hyperpigmentation 12/12/2013  . History of myomectomy 10/12/2013  . Body mass index (BMI) of 28.0-28.9 in adult 10/11/2013  . Dysmenorrhea 10/11/2013  . Fibroids, intramural 10/11/2013  . Personal history of breast cancer 10/11/2013  . Screening for cervical cancer 10/11/2013  . History of blood clots   . Breast cancer (  [place])   . Cyst of tendon sheath 09/28/2012   Willa Rough DPT, ATC Willa Rough 10/23/2017, 12:17 PM  [place] MAIN [facility] SERVICES [address] [place], [place], [address] Phone: [phone]   Fax:  [phone]  Name: Beth Branch MRN: [id number] Date of Birth: 1966/02/28

## 2017-10-23 NOTE — Patient Instructions (Addendum)
   Stabilization: Diaphragmatic Breathing    Lie with knees bent, feet flat. Place one hand on stomach, other on chest. Breathe deeply through nose, lifting belly hand without any motion of hand on chest. Repeat __10__ times per set. Do __1__ set per session. Do ___7_ sessions per week.     Go to psychology today website to look into psychotherapists who fit with your insurance and style in the area.

## 2017-10-27 NOTE — Telephone Encounter (Signed)
Spoke with pt in reference to bladder concerns. Pt voiced understanding.

## 2017-10-30 ENCOUNTER — Ambulatory Visit: Payer: No Typology Code available for payment source

## 2017-10-30 DIAGNOSIS — M62838 Other muscle spasm: Secondary | ICD-10-CM | POA: Diagnosis not present

## 2017-10-30 DIAGNOSIS — M6289 Other specified disorders of muscle: Secondary | ICD-10-CM

## 2017-10-30 DIAGNOSIS — M629 Disorder of muscle, unspecified: Secondary | ICD-10-CM

## 2017-10-30 DIAGNOSIS — R350 Frequency of micturition: Secondary | ICD-10-CM

## 2017-10-30 DIAGNOSIS — R339 Retention of urine, unspecified: Secondary | ICD-10-CM

## 2017-10-30 DIAGNOSIS — R293 Abnormal posture: Secondary | ICD-10-CM

## 2017-10-30 NOTE — Therapy (Signed)
[place] MAIN [facility] SERVICES [address] [place], [place], [address] Phone: [phone]   Fax:  [phone]  Physical Therapy Treatment  Patient Details  Name: Beth Branch MRN: [id number] Date of Birth: 1966/01/18 Referring Provider: John Giovanni   Encounter Date: 10/30/2017  PT End of Session - 10/31/17 1512    Visit Number  2    Number of Visits  12    Date for PT Re-Evaluation  01/15/18    PT Start Time  1110    PT Stop Time  1215    PT Time Calculation (min)  65 min    Activity Tolerance  Patient tolerated treatment well    Behavior During Therapy  [facility] for tasks assessed/performed       Past Medical History:  Diagnosis Date  . Adopted   . BRCA negative 04-05-10  . 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 S/p excision by [facility] 12/18/2015.  Marland Kitchen Complication of anesthesia    REFLUX AFTER ANESTHESIA  . Cyst of tendon sheath 09/28/2012   Overview:  L hand, along the extensor tendons of the long and ring fingers ->[facility] 09/2012. Opted to observe.   . Degenerative disc disease, cervical 11/16/2016   Overview:  Had neck pain ->>Plain films 09/2015 (referred to PT).   . Dysmenorrhea 10/11/2013  . Family history of adverse reaction to anesthesia    adopted  . Fibroids, intramural 10/11/2013  . GERD (gastroesophageal reflux disease)    occ- no meds  . Hemangioma 08/13/2017   Overview:  cavernous hemangioma abdominal wall- removed   . History of blood clots 2011   LEFT ARM-AFTER SURGERY  . Intramural leiomyoma of uterus 09/11/2015   Overview:  Pelvic US 07/2015. Followed by Dr. Kenton Kingfisher - OBGYN.   Marland Kitchen Keloid of skin 12/26/2013  . Liver cyst 11/16/2016   Overview:  Has small benign appearing liver cysts on CT A/P + MRI Liver (2017). Had consult w/ Biliary at [facility] and found to also have a benign hemangioma.    . Pap smear abnormality of cervix with ASCUS favoring benign 08/20/2015  . Postinflammatory hyperpigmentation 12/12/2013  . Seborrheic dermatitis 03/01/2014    Past Surgical History:  Procedure Laterality Date  . BREAST RECONSTRUCTION Bilateral 2011   Dr Greta Doom in [place]    . COMPLETE MASTECTOMY W/ SENTINEL NODE BIOPSY Bilateral 2010   Right breast DCIS  . CYSTOSCOPY WITH URETHRAL DILATATION N/A 09/15/2017   Procedure: CYSTOSCOPY WITH URETHRAL DILATATION;  Surgeon: Abbie Sons, MD;  Location: [facility] ORS;  Service: Urology;  Laterality: N/A;  . EXCISION MASS ABDOMINAL N/A 12/26/2015   EXCISION MASS ABDOMINAL; CAVERNOUS HEMANGIOMA Seeplaputhur Robinette Haines, MD;  Location: [facility] ORS;  Service: General;  Laterality: N/A;  . KNEE ARTHROSCOPY Right     There were no vitals filed for this visit.    Pelvic Floor Physical Therapy Treatment Note  SCREENING  Changes in medications, allergies, or medical history?: just started  Minocycline for acne 90 mg. 1 time per day   SUBJECTIVE  Patient reports: Is going ~ 6 times per day and 0-1 times per night. Has gone fewer times per day since getting the squatty potty. It was 12-20 times prior.  Pain update:   Location of pain: groin, R ant. Hip. Current pain: 1/10  Max pain: 3/10 Least pain: 0/10  Nature of pain:dull achy  Patient Goals: To help empty her bladder and decrease frequency.    OBJECTIVE  Observation/Posture: standing L PSIS high, L iliac crest high, greater pronation on R than L.  Special tests:  Stork: positive B for instability, greater on R>L Supine to sit: R leg long in both supine and seated. Ober: negative B Leg-length: no difference in supine from ASIS to medial malleoli   Range of Motion/Flexibilty:  Spine: pinch with L side-bend, none with R, motion similar Hips: Pt. Appears more flexible than average grossly, ROM WNL B, tightness and tenderness present with abduction of  hips.   Strength/MMT:  LE MMT  LE MMT Left Right  Hip flex:  (L2) /5 /5  Hip ext: 5/5 5/5  Hip abd: 4/5 4+/5  Hip add: 5/5 5/5  Hip IR 4+/5 4/5 P!   Hip ER 4/5 P! 5/5   Pin in ant B hip with rotation Pain in vagina with R add  Abdominal: TTP throughout lower abdomen B with R>L, surgical scar in RLQ from ~ 3 years ago. Psoas TTP on R>L  Gait Assessment: rotates R through shoulders with stance phase on R, pronates on R>L  INTERVENTIONS THIS SESSION: Self Care: discussed bladder diary, urge-suppression technique to manage erronneus signals, breathing for keeping anxiety low to decrease pelvic tension. Therex: educated on and practiced 3 way wall stretch and QL seated stretch to decrease tension on the pelvis and lengthen/relax PFM. Physical Performance: assessed abdomen, strength, gait, ROM and mobility of the hips and spine. Performed supine-to-sit test, ober's test, and stork test as well as leg-length discrepancy measurement for POC developmentfor POC development.   Total Time: 59                         PT Education - 10/31/17 1512    Education provided  Yes    Education Details  educated on HEP, POC, and urge-suppression technique    Person(s) Educated  Patient    Methods  Explanation;Demonstration;Verbal cues;Handout    Comprehension  Verbalized understanding;Returned demonstration;Verbal cues required       PT Short Term Goals - 10/23/17 1158      PT SHORT TERM GOAL #1   Title  Patient will consistently use squatty-potty at home to decrease resistance for urination and allow more complete emptying of bowel/bladder    Time  5    Period  Weeks    Status  New    Target Date  11/27/17      PT SHORT TERM GOAL #2   Title  Patient will demonstrate improved sitting and standing posture to demonstrate learning and decrease stress on the pelvic floor with functional activity.    Time  5    Period  Weeks    Status  New    Target Date  11/27/17       PT SHORT TERM GOAL #3   Title  Patient will report no feeling of discomfort/pain in the R hip and thigh to demonstrate decreased PFM tension and improved posture.    Time  8    Period  Weeks    Status  New    Target Date  12/18/17        PT Long Term Goals - 10/23/17 1202      PT LONG TERM GOAL #1   Title  Patient will report being able to regulary go 3 hours between trips to urinate to demonstrate decreased urinary  frequency and more complete emptying.    Time  12    Period  Weeks    Status  New    Target Date  01/15/18      PT LONG TERM GOAL #2   Title  Patient will demonstrate appropriate body mechanics with bending and lifting heavy objects to allow for decreased stress on the pelvic floor and low back.     Time  12    Period  Weeks    Status  New    Target Date  01/15/18      PT LONG TERM GOAL #3   Title  Patient will score at or below 14/100 on the PFDI and 27/100 on the UIQ to demonstrate a clinically meaningful decrease in disability and distress due to pelvic floor dysfunction.    Baseline  UDI: 44/100 UIQ:57/100     Time  12    Period  Weeks    Status  New    Target Date  01/15/18            Plan - 10/31/17 1513    Clinical Impression Statement  Patient has responded well to education and HEP already, demonstrating a reduction in urinary frequency by ~ 50% with use of a squatty potty at home. Today's session focused on continued assessment to find the root of her PF muscle tightness which appears to be largely due to pelvic mal-allignment and scar tissue/decreased fascial mobility as well as muscular imbalance and posture. Patient will continue to benefit from skilled PT to address these factors as well as directly treat her PFM tightness for decreased symptos and prevention of symptom return.     History and Personal Factors relevant to plan of care:  history of breast cancer and 5 years of high-stress surrounding losing both parents and personal medical issues.     Clinical Presentation  Stable    Clinical Decision Making  Moderate    Rehab Potential  Good    Clinical Impairments Affecting Rehab Potential  history of cancer, longstanding high stress    PT Frequency  1x / week    PT Duration  12 weeks    PT Treatment/Interventions  ADLs/Self Care Home Management;Biofeedback;Moist Heat;Traction;Functional mobility training;Gait training;Therapeutic activities;Therapeutic exercise;Balance training;Neuromuscular re-education;Manual techniques;Scar mobilization;Passive range of motion;Dry needling;Taping    PT Next Visit Plan  address L hip hike and add in TA recruitment/posture    PT Home Exercise Plan  Diaphragmatic breathing, squatty potty, 3-way stretch and QL stretch, urge-suppression    Recommended Other Services  counselling for stress/grief     Consulted and Agree with Plan of Care  Patient       Patient will benefit from skilled therapeutic intervention in order to improve the following deficits and impairments:  Abnormal gait, Increased fascial restricitons, Improper body mechanics, Pain, Decreased scar mobility, Increased muscle spasms, Postural dysfunction, Impaired flexibility, Other (comment)  Visit Diagnosis: Other muscle spasm  Abnormal posture  Muscular imbalance  Incomplete bladder emptying  Urinary frequency     Problem List Patient Active Problem List   Diagnosis Date Noted  . Urinary frequency 08/14/2017  . Incomplete bladder emptying 08/14/2017  . Urethral bleeding 08/14/2017  . Hemangioma 08/13/2017  . 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 Right hip pain 04/24/2017  . Atypical glandular cells of undetermined significance (AGUS) on cervical Pap smear 02/23/2017  . Degenerative disc disease, cervical 11/16/2016  . Liver cyst 11/16/2016  . Acute pain of left knee 11/11/2016  .  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 History of abnormal cervical Pap smear  09/14/2015  . History of breast cancer 09/11/2015  . Intramural leiomyoma of uterus 09/11/2015  . 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 Pap smear abnormality of cervix with ASCUS favoring benign 08/20/2015  . Vaginal discharge 08/20/2015  . Abscess of arm, right 03/01/2014  . Seborrheic dermatitis 03/01/2014  . Keloid of skin 12/26/2013  . Acne 12/12/2013  . Postinflammatory hyperpigmentation 12/12/2013  . History of myomectomy 10/12/2013  . Body mass index (BMI) of 28.0-28.9 in adult 10/11/2013  . Dysmenorrhea 10/11/2013  . Fibroids, intramural 10/11/2013  . Personal history of breast cancer 10/11/2013  . Screening for cervical cancer 10/11/2013  . History of blood clots   . Breast cancer ([place])   . Cyst of tendon sheath 09/28/2012   Willa Rough DPT, ATC Willa Rough 10/31/2017, 3:23 PM  [place] MAIN [facility] SERVICES [address] [place], [place], [address] Phone: [phone]   Fax:  [phone]  Name: Beth Branch MRN: [id number] Date of Birth: 01/26/1966

## 2017-10-30 NOTE — Patient Instructions (Signed)
3-Way Wall Stretches for Pelvic Floor Lengthening   Bring bottom close to the wall and gently press straighten knees to feel a stretch down the back of you thighs. Hold while taking 5 deep belly breaths and feeling the pelvic floor relax and lower on each inhale.    Let your legs fall to the side to feel a stretch on the inside of your thighs. If the stretch is too intense you can use a pillow to take some of the weight off by wedging it on the outside of your hips. Hold while taking 5 deep belly breaths and feeling the pelvic floor relax and lower on each inhale.    Slide feet down the wall and move hips slightly away from the wall and then let your knees fall to the sides so you feel a stretch on the inside of the thighs near your groin. Hold while taking 5 deep belly breaths and feeling the pelvic floor relax and lower on each inhale.     *Perform each stretch in sequence 3 times, once a day    Urge Suppression:  When you do not think that the bladder is full but you are feeling the urge to go, do 5 quick flicks and tell your bladder that you are not ready to go yet and then check back in to see if the urge goes away. Try this 2-3 times if necessary and then if it does not resolve, slowly, calmly walk to the bathroom.

## 2017-11-03 ENCOUNTER — Ambulatory Visit: Payer: 59 | Admitting: Urology

## 2017-11-06 ENCOUNTER — Ambulatory Visit: Payer: No Typology Code available for payment source

## 2017-11-06 DIAGNOSIS — M62838 Other muscle spasm: Secondary | ICD-10-CM | POA: Diagnosis not present

## 2017-11-06 DIAGNOSIS — R339 Retention of urine, unspecified: Secondary | ICD-10-CM

## 2017-11-06 DIAGNOSIS — M629 Disorder of muscle, unspecified: Secondary | ICD-10-CM

## 2017-11-06 DIAGNOSIS — R293 Abnormal posture: Secondary | ICD-10-CM

## 2017-11-06 DIAGNOSIS — M6289 Other specified disorders of muscle: Secondary | ICD-10-CM

## 2017-11-06 DIAGNOSIS — R350 Frequency of micturition: Secondary | ICD-10-CM

## 2017-11-06 NOTE — Therapy (Signed)
[place] MAIN [facility] SERVICES [address] [place], [place], [address] Phone: [phone]   Fax:  [phone]  Physical Therapy Treatment  Patient Details  Name: Beth Branch MRN: [id number] Date of Birth: 10/29/1965 Referring Provider: John Giovanni   Encounter Date: 11/06/2017  PT End of Session - 11/06/17 1700    Visit Number  3    Number of Visits  12    Date for PT Re-Evaluation  01/15/18    PT Start Time  1112    PT Stop Time  1204    PT Time Calculation (min)  52 min    Activity Tolerance  Patient tolerated treatment well    Behavior During Therapy  [facility] for tasks assessed/performed       Past Medical History:  Diagnosis Date  . Adopted   . BRCA negative 04-05-10  . 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 S/p excision by [facility] 12/18/2015.  Marland Kitchen Complication of anesthesia    REFLUX AFTER ANESTHESIA  . Cyst of tendon sheath 09/28/2012   Overview:  L hand, along the extensor tendons of the long and ring fingers ->[facility] 09/2012. Opted to observe.   . Degenerative disc disease, cervical 11/16/2016   Overview:  Had neck pain ->>Plain films 09/2015 (referred to PT).   . Dysmenorrhea 10/11/2013  . Family history of adverse reaction to anesthesia    adopted  . Fibroids, intramural 10/11/2013  . GERD (gastroesophageal reflux disease)    occ- no meds  . Hemangioma 08/13/2017   Overview:  cavernous hemangioma abdominal wall- removed   . History of blood clots 2011   LEFT ARM-AFTER SURGERY  . Intramural leiomyoma of uterus 09/11/2015   Overview:  Pelvic US 07/2015. Followed by Dr. Kenton Kingfisher - OBGYN.   Marland Kitchen Keloid of skin 12/26/2013  . Liver cyst 11/16/2016   Overview:  Has small benign appearing liver cysts on CT A/P + MRI Liver (2017). Had consult w/ Biliary at [facility] and found to also have a benign hemangioma.    . Pap smear abnormality of cervix with ASCUS favoring benign 08/20/2015  . Postinflammatory hyperpigmentation 12/12/2013  . Seborrheic dermatitis 03/01/2014    Past Surgical History:  Procedure Laterality Date  . BREAST RECONSTRUCTION Bilateral 2011   Dr Greta Doom in [place]    . COMPLETE MASTECTOMY W/ SENTINEL NODE BIOPSY Bilateral 2010   Right breast DCIS  . CYSTOSCOPY WITH URETHRAL DILATATION N/A 09/15/2017   Procedure: CYSTOSCOPY WITH URETHRAL DILATATION;  Surgeon: Abbie Sons, MD;  Location: [facility] ORS;  Service: Urology;  Laterality: N/A;  . EXCISION MASS ABDOMINAL N/A 12/26/2015   EXCISION MASS ABDOMINAL; CAVERNOUS HEMANGIOMA Seeplaputhur Robinette Haines, MD;  Location: [facility] ORS;  Service: General;  Laterality: N/A;  . KNEE ARTHROSCOPY Right     There were no vitals filed for this visit.    Pelvic Floor Physical Therapy Treatment Note  SCREENING  Changes in medications, allergies, or medical history?: no     SUBJECTIVE  Patient reports: She has been doing pretty well, noticing that when she uses urge suppression, the next time she feels the urge it feels more urgent and she has urine to empty. She is urinating between 8-12 seconds most of the time.   Pain update: Not assessed  Patient Goals: To help empty her bladder and decrease frequency.  OBJECTIVE  INTERVENTIONS THIS SESSION: Manual: TP release to L QL, Psoas, Piriformis, TFL, erector spinae, and obliques, as well as grade 3-4 PA mobs to sacrum and L leg pull with oscillation and quick pull and MET for L ilium up-slip realignment and decreased pain/ neural sensitivity.  Total Time: 52 min.                       PT Education - 11/06/17 1659    Education provided  Yes    Education Details  Educated on POC and treatment methods    Person(s) Educated  Patient    Methods  Explanation    Comprehension  Verbalized understanding       PT Short Term Goals - 10/23/17 1158      PT  SHORT TERM GOAL #1   Title  Patient will consistently use squatty-potty at home to decrease resistance for urination and allow more complete emptying of bowel/bladder    Time  5    Period  Weeks    Status  New    Target Date  11/27/17      PT SHORT TERM GOAL #2   Title  Patient will demonstrate improved sitting and standing posture to demonstrate learning and decrease stress on the pelvic floor with functional activity.    Time  5    Period  Weeks    Status  New    Target Date  11/27/17      PT SHORT TERM GOAL #3   Title  Patient will report no feeling of discomfort/pain in the R hip and thigh to demonstrate decreased PFM tension and improved posture.    Time  8    Period  Weeks    Status  New    Target Date  12/18/17        PT Long Term Goals - 10/23/17 1202      PT LONG TERM GOAL #1   Title  Patient will report being able to regulary go 3 hours between trips to urinate to demonstrate decreased urinary frequency and more complete emptying.    Time  12    Period  Weeks    Status  New    Target Date  01/15/18      PT LONG TERM GOAL #2   Title  Patient will demonstrate appropriate body mechanics with bending and lifting heavy objects to allow for decreased stress on the pelvic floor and low back.     Time  12    Period  Weeks    Status  New    Target Date  01/15/18      PT LONG TERM GOAL #3   Title  Patient will score at or below 14/100 on the PFDI and 27/100 on the UIQ to demonstrate a clinically meaningful decrease in disability and distress due to pelvic floor dysfunction.    Baseline  UDI: 44/100 UIQ:57/100     Time  12    Period  Weeks    Status  New    Target Date  01/15/18            Plan - 11/06/17 1701    Clinical Impression Statement  Patient responded well to manual therapy today demonstrating improved pelvic allignment at end of session and reporting decreased urinary urge after realignment.     History and Personal Factors relevant to plan of care:   history of breast cancer and 5 years of high-stress surrounding losing both  parents and personal medical issues.    Clinical Presentation  Stable    Clinical Decision Making  Moderate    Rehab Potential  Good    Clinical Impairments Affecting Rehab Potential  history of cancer, longstanding high stress    PT Frequency  1x / week    PT Duration  12 weeks    PT Treatment/Interventions  ADLs/Self Care Home Management;Biofeedback;Moist Heat;Traction;Functional mobility training;Gait training;Therapeutic activities;Therapeutic exercise;Balance training;Neuromuscular re-education;Manual techniques;Scar mobilization;Passive range of motion;Dry needling;Taping    PT Next Visit Plan  re-assess pelvic allignment and PFM spasm and perform manual as necessary, teach TA recruitment/ pelvic tilts    PT Home Exercise Plan  Diaphragmatic breathing, squatty potty, 3-way stretch and QL stretch, urge-suppression    Recommended Other Services  counselling for stress/grief    Consulted and Agree with Plan of Care  Patient       Patient will benefit from skilled therapeutic intervention in order to improve the following deficits and impairments:  Abnormal gait, Increased fascial restricitons, Improper body mechanics, Pain, Decreased scar mobility, Increased muscle spasms, Postural dysfunction, Impaired flexibility, Other (comment)  Visit Diagnosis: Other muscle spasm  Abnormal posture  Muscular imbalance  Incomplete bladder emptying  Urinary frequency     Problem List Patient Active Problem List   Diagnosis Date Noted  . Urinary frequency 08/14/2017  . Incomplete bladder emptying 08/14/2017  . Urethral bleeding 08/14/2017  . Hemangioma 08/13/2017  . 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 Right hip pain 04/24/2017  . Atypical glandular cells of undetermined significance (AGUS) on cervical Pap smear 02/23/2017  . Degenerative disc disease, cervical 11/16/2016  . Liver cyst  11/16/2016  . Acute pain of left knee 11/11/2016  . 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 History of abnormal cervical Pap smear 09/14/2015  . History of breast cancer 09/11/2015  . Intramural leiomyoma of uterus 09/11/2015  . 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 Pap smear abnormality of cervix with ASCUS favoring benign 08/20/2015  . Vaginal discharge 08/20/2015  . Abscess of arm, right 03/01/2014  . Seborrheic dermatitis 03/01/2014  . Keloid of skin 12/26/2013  . Acne 12/12/2013  . Postinflammatory hyperpigmentation 12/12/2013  . History of myomectomy 10/12/2013  . Body mass index (BMI) of 28.0-28.9 in adult 10/11/2013  . Dysmenorrhea 10/11/2013  . Fibroids, intramural 10/11/2013  . Personal history of breast cancer 10/11/2013  . Screening for cervical cancer 10/11/2013  . History of blood clots   . Breast cancer ([place])   . Cyst of tendon sheath 09/28/2012   Willa Rough DPT, ATC Willa Rough 11/06/2017, 5:10 PM  [place] MAIN [facility] SERVICES [address] [place], [place], [address] Phone: [phone]   Fax:  [phone]  Name: Beth Branch MRN: [id number] Date of Birth: December 01, 1965

## 2017-11-09 ENCOUNTER — Ambulatory Visit: Payer: No Typology Code available for payment source

## 2017-11-20 ENCOUNTER — Ambulatory Visit: Payer: No Typology Code available for payment source | Attending: Urology

## 2017-11-20 DIAGNOSIS — M62838 Other muscle spasm: Secondary | ICD-10-CM | POA: Insufficient documentation

## 2017-11-20 DIAGNOSIS — R293 Abnormal posture: Secondary | ICD-10-CM | POA: Diagnosis present

## 2017-11-20 DIAGNOSIS — M629 Disorder of muscle, unspecified: Secondary | ICD-10-CM | POA: Diagnosis present

## 2017-11-20 DIAGNOSIS — R339 Retention of urine, unspecified: Secondary | ICD-10-CM | POA: Diagnosis present

## 2017-11-20 DIAGNOSIS — M6289 Other specified disorders of muscle: Secondary | ICD-10-CM

## 2017-11-20 DIAGNOSIS — R350 Frequency of micturition: Secondary | ICD-10-CM | POA: Diagnosis present

## 2017-11-20 NOTE — Therapy (Signed)
[place] MAIN [facility] SERVICES [address] [place], [place], [address] Phone: [phone]   Fax:  [phone]  Physical Therapy Treatment  Patient Details  Name: Beth Branch MRN: [id number] Date of Birth: 1966-07-22 Referring Provider: John Giovanni   Encounter Date: 11/20/2017  PT End of Session - 11/20/17 1252    Visit Number  4    Number of Visits  12    Date for PT Re-Evaluation  01/15/18    PT Start Time  1102    PT Stop Time  1210    PT Time Calculation (min)  68 min    Activity Tolerance  Patient tolerated treatment well    Behavior During Therapy  [facility] for tasks assessed/performed       Past Medical History:  Diagnosis Date  . Adopted   . BRCA negative 04-05-10  . 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 S/p excision by [facility] 12/18/2015.  Marland Kitchen Complication of anesthesia    REFLUX AFTER ANESTHESIA  . Cyst of tendon sheath 09/28/2012   Overview:  L hand, along the extensor tendons of the long and ring fingers ->[facility] 09/2012. Opted to observe.   . Degenerative disc disease, cervical 11/16/2016   Overview:  Had neck pain ->>Plain films 09/2015 (referred to PT).   . Dysmenorrhea 10/11/2013  . Family history of adverse reaction to anesthesia    adopted  . Fibroids, intramural 10/11/2013  . GERD (gastroesophageal reflux disease)    occ- no meds  . Hemangioma 08/13/2017   Overview:  cavernous hemangioma abdominal wall- removed   . History of blood clots 2011   LEFT ARM-AFTER SURGERY  . Intramural leiomyoma of uterus 09/11/2015   Overview:  Pelvic US 07/2015. Followed by Dr. Kenton Kingfisher - OBGYN.   Marland Kitchen Keloid of skin 12/26/2013  . Liver cyst 11/16/2016   Overview:  Has small benign appearing liver cysts on CT A/P + MRI Liver (2017). Had consult w/ Biliary at [facility] and found to also have a benign hemangioma.    . Pap smear abnormality of cervix with ASCUS favoring benign 08/20/2015  . Postinflammatory hyperpigmentation 12/12/2013  . Seborrheic dermatitis 03/01/2014    Past Surgical History:  Procedure Laterality Date  . BREAST RECONSTRUCTION Bilateral 2011   Dr Greta Doom in [place]    . COMPLETE MASTECTOMY W/ SENTINEL NODE BIOPSY Bilateral 2010   Right breast DCIS  . CYSTOSCOPY WITH URETHRAL DILATATION N/A 09/15/2017   Procedure: CYSTOSCOPY WITH URETHRAL DILATATION;  Surgeon: Abbie Sons, MD;  Location: [facility] ORS;  Service: Urology;  Laterality: N/A;  . EXCISION MASS ABDOMINAL N/A 12/26/2015   EXCISION MASS ABDOMINAL; CAVERNOUS HEMANGIOMA Seeplaputhur Robinette Haines, MD;  Location: [facility] ORS;  Service: General;  Laterality: N/A;  . KNEE ARTHROSCOPY Right     There were no vitals filed for this visit.    Pelvic Floor Physical Therapy Treatment Note  SCREENING  Changes in medications, allergies, or medical history?: no     SUBJECTIVE  Patient reports: Has had to get up over the last couple nights.but one was due to having coffee late in the evening, the other was probably due to restless sleep. Has been able to use urge suppression at least one time successfully.   Pain update: No hip pain over last week  Patient Goals: Prevent return of hip/groin pain and  decrease urgency/frequency.    OBJECTIVE  Changes in: Posture/Observations:  Pronation B, R>L. L ASIS higher than R. L facing pelvis, R PSIS deep compared to L.   Palpation: TTP through L QL and Piriformis and B adductors.   INTERVENTIONS THIS SESSION: Self care: educated patient on using arch support in her shoes to allow the adductors to relax, to avoid unilateral oblique exercises while we are still working on improving her pelvic alignment, to be aware of her knee tracking when doing spin classes so as to not over-activate the adductors, and to increase lateral hip ( abductor) exercise with her personal trainer  to use reciprocal inhibition for improved adductor relaxation. Patient also educated on the concentration of acidic, alcoholic, and caffeine beverages affecting urinary frequency.  Manual: TP release to L QL and Piriformis, PA grade 4 mobs to sacrum to improve pelvic alignment. TP release to adductors B for decreased spasm and referral into hips and pelvis.  Total time: 68 min.                          PT Education - 11/20/17 1251    Education provided  Yes    Education Details  see Pt. instructions and interventions this session.    Person(s) Educated  Patient    Methods  Explanation;Handout    Comprehension  Verbalized understanding;Returned demonstration       PT Short Term Goals - 10/23/17 1158      PT SHORT TERM GOAL #1   Title  Patient will consistently use squatty-potty at home to decrease resistance for urination and allow more complete emptying of bowel/bladder    Time  5    Period  Weeks    Status  New    Target Date  11/27/17      PT SHORT TERM GOAL #2   Title  Patient will demonstrate improved sitting and standing posture to demonstrate learning and decrease stress on the pelvic floor with functional activity.    Time  5    Period  Weeks    Status  New    Target Date  11/27/17      PT SHORT TERM GOAL #3   Title  Patient will report no feeling of discomfort/pain in the R hip and thigh to demonstrate decreased PFM tension and improved posture.    Time  8    Period  Weeks    Status  New    Target Date  12/18/17        PT Long Term Goals - 11/20/17 1257      PT LONG TERM GOAL #1   Title  Patient will report being able to regulary go 3 hours between trips to urinate to demonstrate decreased urinary frequency and more complete emptying.            Plan - 11/20/17 1253    Clinical Impression Statement  Patient responded well to manual treatment today though her muscles take a long time to respond. She demonstrated decreased spasms and  tenderness following treatment. Discucced potential use of dry-needling to get faster reduction of adductor spasms and subsequent PFM relaxation.    History and Personal Factors relevant to plan of care:  history of breast cancer and 5 years of high-stress surrounding losing both parents and personal medical issues    Clinical Presentation  Stable    Clinical Decision Making  Moderate    Rehab Potential  Good  Clinical Impairments Affecting Rehab Potential  history of cancer, longstanding high stress    PT Frequency  1x / week    PT Duration  12 weeks    PT Treatment/Interventions  ADLs/Self Care Home Management;Biofeedback;Moist Heat;Traction;Functional mobility training;Gait training;Therapeutic activities;Therapeutic exercise;Balance training;Neuromuscular re-education;Manual techniques;Scar mobilization;Passive range of motion;Dry needling;Taping    PT Next Visit Plan  re-assess pelvic allignment and PFM spasm and perform manual as necessary, teach TA recruitment/ pelvic tilts    PT Home Exercise Plan  Diaphragmatic breathing, squatty potty, 3-way stretch and QL stretch, urge-suppression    Recommended Other Services  counseling for stress/grief    Consulted and Agree with Plan of Care  Patient       Patient will benefit from skilled therapeutic intervention in order to improve the following deficits and impairments:  Abnormal gait, Increased fascial restricitons, Improper body mechanics, Pain, Decreased scar mobility, Increased muscle spasms, Postural dysfunction, Impaired flexibility, Other (comment)  Visit Diagnosis: Other muscle spasm  Abnormal posture  Muscular imbalance  Incomplete bladder emptying  Urinary frequency     Problem List Patient Active Problem List   Diagnosis Date Noted  . Urinary frequency 08/14/2017  . Incomplete bladder emptying 08/14/2017  . Urethral bleeding 08/14/2017  . Hemangioma 08/13/2017  . Ductal carcinoma in situ (DCIS) of breast  06/29/2017  . Right hip pain 04/24/2017  . Atypical glandular cells of undetermined significance (AGUS) on cervical Pap smear 02/23/2017  . Degenerative disc disease, cervical 11/16/2016  . Liver cyst 11/16/2016  . Acute pain of left knee 11/11/2016  . 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 History of abnormal cervical Pap smear 09/14/2015  . History of breast cancer 09/11/2015  . Intramural leiomyoma of uterus 09/11/2015  . 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 Pap smear abnormality of cervix with ASCUS favoring benign 08/20/2015  . Vaginal discharge 08/20/2015  . Abscess of arm, right 03/01/2014  . Seborrheic dermatitis 03/01/2014  . Keloid of skin 12/26/2013  . Acne 12/12/2013  . Postinflammatory hyperpigmentation 12/12/2013  . History of myomectomy 10/12/2013  . Body mass index (BMI) of 28.0-28.9 in adult 10/11/2013  . Dysmenorrhea 10/11/2013  . Fibroids, intramural 10/11/2013  . Personal history of breast cancer 10/11/2013  . Screening for cervical cancer 10/11/2013  . History of blood clots   . Breast cancer ([place])   . Cyst of tendon sheath 09/28/2012   Willa Rough DPT, ATC Willa Rough 11/20/2017, 12:58 PM  [place] MAIN [facility] SERVICES [address] [place], [place], [address] Phone: [phone]   Fax:  [phone]  Name: Beth Branch MRN: [id number] Date of Birth: 1965/10/22

## 2017-11-20 NOTE — Patient Instructions (Signed)
1) lay off oblique exercise other than side-planks.  2) look into arch support for pronation (the walking [employer])  3) strengthen lateral hip muscles (abductors)

## 2017-12-04 ENCOUNTER — Ambulatory Visit: Payer: No Typology Code available for payment source

## 2017-12-18 ENCOUNTER — Ambulatory Visit: Payer: No Typology Code available for payment source | Attending: Urology

## 2017-12-18 DIAGNOSIS — M629 Disorder of muscle, unspecified: Secondary | ICD-10-CM | POA: Insufficient documentation

## 2017-12-18 DIAGNOSIS — M6289 Other specified disorders of muscle: Secondary | ICD-10-CM

## 2017-12-18 DIAGNOSIS — R293 Abnormal posture: Secondary | ICD-10-CM | POA: Diagnosis present

## 2017-12-18 DIAGNOSIS — R350 Frequency of micturition: Secondary | ICD-10-CM | POA: Diagnosis present

## 2017-12-18 DIAGNOSIS — R339 Retention of urine, unspecified: Secondary | ICD-10-CM | POA: Diagnosis present

## 2017-12-18 DIAGNOSIS — M62838 Other muscle spasm: Secondary | ICD-10-CM | POA: Diagnosis not present

## 2017-12-18 NOTE — Patient Instructions (Addendum)
   Breathe in, let belly relax down toward the floor and then breathe out, pulling the lower belly in toward the backbone.   Repeat this __3x10_ times _1__ times per day        Inhale as you come into the anterior pelvic tilt, exhale with the posterior tilt. Do 10-20 tilts  In either sittting or standing 1-2 times per day, focusing on lengthening the pelvic floor with inhale and tightening the lower tummy muscles with exhale.  Self Posterior Fourchette Stretching/Mobilization    1) Wash your hands and prop your body up so you can easily reach the vagina, bring hand-held mirror if desired.  2) Apply lubricant to the thumb and vaginal opening  3) Place thumb ~ 1/2 an inch into the vagina with the pad of the thumb pointed down and apply gentle pressure to the posterior fourchette.  4) Gently sweep the thumb side to side and in/out while maintaining pressure down toward the anus. Make sure the pressure is not so great that your muscles tighten up and guard, just enough to create slight discomfort.  Do this for ~ 3 min. Per night to decrease tightness and tenderness at the vaginal opening.

## 2017-12-18 NOTE — Therapy (Signed)
[place] MAIN [facility] SERVICES [address] [place], [place], [address] Phone: [phone]   Fax:  [phone]  Physical Therapy Treatment  Patient Details  Name: Beth Branch MRN: [id number] Date of Birth: [birth year] Referring Provider: John Giovanni   Encounter Date: 12/18/2017  PT End of Session - 12/18/17 1119    Visit Number  5    Number of Visits  12    Date for PT Re-Evaluation  01/15/18    PT Start Time  1009    PT Stop Time  1103    PT Time Calculation (min)  54 min       Past Medical History:  Diagnosis Date  . Adopted   . BRCA negative 04-05-10  . 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 S/p excision by [facility] 12/18/2015.  Marland Kitchen Complication of anesthesia    REFLUX AFTER ANESTHESIA  . Cyst of tendon sheath 09/28/2012   Overview:  L hand, along the extensor tendons of the long and ring fingers ->[facility] 09/2012. Opted to observe.   . Degenerative disc disease, cervical 11/16/2016   Overview:  Had neck pain ->>Plain films 09/2015 (referred to PT).   . Dysmenorrhea 10/11/2013  . Family history of adverse reaction to anesthesia    adopted  . Fibroids, intramural 10/11/2013  . GERD (gastroesophageal reflux disease)    occ- no meds  . Hemangioma 08/13/2017   Overview:  cavernous hemangioma abdominal wall- removed   . History of blood clots 2011   LEFT ARM-AFTER SURGERY  . Intramural leiomyoma of uterus 09/11/2015   Overview:  Pelvic US 07/2015. Followed by Dr. Kenton Kingfisher - OBGYN.   Marland Kitchen Keloid of skin 12/26/2013  . Liver cyst 11/16/2016   Overview:  Has small benign appearing liver cysts on CT A/P + MRI Liver (2017). Had consult w/ Biliary at [facility] and found to also have a benign hemangioma.   . Pap smear abnormality of cervix with ASCUS favoring benign 08/20/2015  . Postinflammatory hyperpigmentation  12/12/2013  . Seborrheic dermatitis 03/01/2014    Past Surgical History:  Procedure Laterality Date  . BREAST RECONSTRUCTION Bilateral 2011   Dr Greta Doom in [place]    . COMPLETE MASTECTOMY W/ SENTINEL NODE BIOPSY Bilateral 2010   Right breast DCIS  . CYSTOSCOPY WITH URETHRAL DILATATION N/A 09/15/2017   Procedure: CYSTOSCOPY WITH URETHRAL DILATATION;  Surgeon: Abbie Sons, MD;  Location: [facility] ORS;  Service: Urology;  Laterality: N/A;  . EXCISION MASS ABDOMINAL N/A 12/26/2015   EXCISION MASS ABDOMINAL; CAVERNOUS HEMANGIOMA Seeplaputhur Robinette Haines, MD;  Location: [facility] ORS;  Service: General;  Laterality: N/A;  . KNEE ARTHROSCOPY Right     There were no vitals filed for this visit.    Pelvic Floor Physical Therapy Treatment Note  SCREENING  Changes in medications, allergies, or medical history?:no     SUBJECTIVE  Patient reports: She is doing well, is able to go 2-3 hours in-between voids now. Has not had any hip pain return. Her biggest pain is in her R arm/hand now.  Precautions:  Hx. Of cancer  Pain update: No pain in hip still, has some pain in her L arm/hand today.  Patient Goals: Prevent return of hip/groin pain and decrease urgency/frequency.    OBJECTIVE  Changes in:  Range of Motion/Flexibilty:  Patient was able to improve pelvic tilt  after TA recruitment exercises, feels pulling in the LB with posterior tilt in standing only.  Strength/MMT:  LE MMT:  Pelvic floor: Continues to demonstrate TP's throughout PFM but reduced sensitivity compared to initial evaluation. Patient responded well to TP release and had resolution of multiple TP's B. Abdominal:   Palpation:  Gait Analysis:  INTERVENTIONS THIS SESSION: NM re-ed: Patient educated on and practiced TA recruitment/strengthening in mod-quad and Educated on posture and pelvic tilts in seated and standing all to improve pelvic position and allow for relaxation of the PFM.  Manual: Performed  internal TP release to PR, OI, PC and coccygeus B to decrease pain and spasm of the PFM, decrease urgency/frequency and allow for more comfortable gynecological exams. Educated patient on how to perform self TP release/massage to the posterior fourchette to continue to decrease tension and discomfort in this area, her greatest area of tension.  Total time: 54 min.                       PT Education - 12/18/17 1119    Education provided  Yes    Education Details  See Pt. Instructions and Interventions this session    Person(s) Educated  Patient    Methods  Explanation;Demonstration;Tactile cues;Verbal cues;Handout    Comprehension  Verbalized understanding;Returned demonstration;Verbal cues required;Tactile cues required       PT Short Term Goals - 10/23/17 1158      PT SHORT TERM GOAL #1   Title  Patient will consistently use squatty-potty at home to decrease resistance for urination and allow more complete emptying of bowel/bladder    Time  5    Period  Weeks    Status  New    Target Date  11/27/17      PT SHORT TERM GOAL #2   Title  Patient will demonstrate improved sitting and standing posture to demonstrate learning and decrease stress on the pelvic floor with functional activity.    Time  5    Period  Weeks    Status  New    Target Date  11/27/17      PT SHORT TERM GOAL #3   Title  Patient will report no feeling of discomfort/pain in the R hip and thigh to demonstrate decreased PFM tension and improved posture.    Time  8    Period  Weeks    Status  New    Target Date  12/18/17        PT Long Term Goals - 11/20/17 1257      PT LONG TERM GOAL #1   Title  Patient will report being able to regulary go 3 hours between trips to urinate to demonstrate decreased urinary frequency and more complete emptying.            Plan - 12/18/17 1120    Clinical Impression Statement  Patient responded well to Internal TP release today and education on  posture/TA recruitment. She has improved in symptoms and we will start to focus more on strengthening to maintain improvement in future visits. Continue per POC.    Clinical Presentation  Stable    Clinical Decision Making  Moderate    Rehab Potential  Good    Clinical Impairments Affecting Rehab Potential  history of cancer, longstanding high stress    PT Frequency  1x / week    PT Duration  12 weeks    PT Treatment/Interventions  ADLs/Self Care Home Management;Biofeedback;Moist Heat;Traction;Functional mobility training;Gait  training;Therapeutic activities;Therapeutic exercise;Balance training;Neuromuscular re-education;Manual techniques;Scar mobilization;Passive range of motion;Dry needling;Taping    PT Next Visit Plan  TP release and MFR with tool to hip flexors and adductors. begin tall-kneel sequence     PT Home Exercise Plan  Diaphragmatic breathing, squatty potty, 3-way stretch and QL stretch, urge-suppression, TA in mod-quad and pelvic tilts, self posterior fourchette.    Consulted and Agree with Plan of Care  Patient       Patient will benefit from skilled therapeutic intervention in order to improve the following deficits and impairments:  Abnormal gait, Increased fascial restricitons, Improper body mechanics, Pain, Decreased scar mobility, Increased muscle spasms, Postural dysfunction, Impaired flexibility, Other (comment)  Visit Diagnosis: Other muscle spasm  Abnormal posture  Muscular imbalance  Incomplete bladder emptying  Urinary frequency     Problem List Patient Active Problem List   Diagnosis Date Noted  . Urinary frequency 08/14/2017  . Incomplete bladder emptying 08/14/2017  . Urethral bleeding 08/14/2017  . Hemangioma 08/13/2017  . 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 Right hip pain 04/24/2017  . Atypical glandular cells of undetermined significance (AGUS) on cervical Pap smear 02/23/2017  . Degenerative disc disease, cervical  11/16/2016  . Liver cyst 11/16/2016  . Acute pain of left knee 11/11/2016  . 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 History of abnormal cervical Pap smear 09/14/2015  . History of breast cancer 09/11/2015  . Intramural leiomyoma of uterus 09/11/2015  . 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 Pap smear abnormality of cervix with ASCUS favoring benign 08/20/2015  . Vaginal discharge 08/20/2015  . Abscess of arm, right 03/01/2014  . Seborrheic dermatitis 03/01/2014  . Keloid of skin 12/26/2013  . Acne 12/12/2013  . Postinflammatory hyperpigmentation 12/12/2013  . History of myomectomy 10/12/2013  . Body mass index (BMI) of 28.0-28.9 in adult 10/11/2013  . Dysmenorrhea 10/11/2013  . Fibroids, intramural 10/11/2013  . Personal history of breast cancer 10/11/2013  . Screening for cervical cancer 10/11/2013  . History of blood clots   . Breast cancer ([place])   . Cyst of tendon sheath 09/28/2012   Willa Rough DPT, ATC Willa Rough 12/18/2017, 11:25 AM  [place] MAIN [facility] SERVICES [address] [place], [place], [address] Phone: [phone]   Fax:  [phone]  Name: ESTELLA MALATESTA MRN: [id number] Date of Birth: [birth year]

## 2017-12-25 ENCOUNTER — Ambulatory Visit: Payer: No Typology Code available for payment source

## 2017-12-25 DIAGNOSIS — M62838 Other muscle spasm: Secondary | ICD-10-CM | POA: Diagnosis not present

## 2017-12-25 DIAGNOSIS — R350 Frequency of micturition: Secondary | ICD-10-CM

## 2017-12-25 DIAGNOSIS — R293 Abnormal posture: Secondary | ICD-10-CM

## 2017-12-25 DIAGNOSIS — R339 Retention of urine, unspecified: Secondary | ICD-10-CM

## 2017-12-25 DIAGNOSIS — M629 Disorder of muscle, unspecified: Secondary | ICD-10-CM

## 2017-12-25 DIAGNOSIS — M6289 Other specified disorders of muscle: Secondary | ICD-10-CM

## 2017-12-25 NOTE — Patient Instructions (Addendum)
  Hold for 5 breaths, repeat 2-3 times on each side.  Hold for 5 seconds, 5 times and then hold a steady stretch for 30 seconds (5 breaths)    2) Keep your knee tracking over your toe and your weight back on your heels for squats, leg extensions, etc.  3) With walking, keep ~2-3 inches between your feet when walking and make sure that your lower tummy muscles are engaged ~ 50% the whole time.   4) On the treadmill lean slightly forward to get your body into a straight line rather than extending the torso.  5) Get some arch cookies online or from [employer] and look into shoes for pronators at either [employer] or the walking company ([employer])

## 2017-12-25 NOTE — Therapy (Signed)
[place] MAIN [facility] SERVICES [address] [place], [place], [address] Phone: [phone]   Fax:  [phone]  Physical Therapy Treatment  Patient Details  Name: Beth Branch MRN: [id number] Date of Birth: [birth year] Referring Provider: John Giovanni   Encounter Date: 12/25/2017  PT End of Session - 12/25/17 1124    Visit Number  6    Number of Visits  12    Date for PT Re-Evaluation  01/15/18    PT Start Time  1012    PT Stop Time  1122    PT Time Calculation (min)  70 min    Activity Tolerance  Patient tolerated treatment well    Behavior During Therapy  [facility] for tasks assessed/performed       Past Medical History:  Diagnosis Date  . Adopted   . BRCA negative 04-05-10  . 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 S/p excision by [facility] 12/18/2015.  Marland Kitchen Complication of anesthesia    REFLUX AFTER ANESTHESIA  . Cyst of tendon sheath 09/28/2012   Overview:  L hand, along the extensor tendons of the long and ring fingers ->[facility] 09/2012. Opted to observe.   . Degenerative disc disease, cervical 11/16/2016   Overview:  Had neck pain ->>Plain films 09/2015 (referred to PT).   . Dysmenorrhea 10/11/2013  . Family history of adverse reaction to anesthesia    adopted  . Fibroids, intramural 10/11/2013  . GERD (gastroesophageal reflux disease)    occ- no meds  . Hemangioma 08/13/2017   Overview:  cavernous hemangioma abdominal wall- removed   . History of blood clots 2011   LEFT ARM-AFTER SURGERY  . Intramural leiomyoma of uterus 09/11/2015   Overview:  Pelvic US 07/2015. Followed by Dr. Kenton Kingfisher - OBGYN.   Marland Kitchen Keloid of skin 12/26/2013  . Liver cyst 11/16/2016   Overview:  Has small benign appearing liver cysts on CT A/P + MRI Liver (2017). Had consult w/ Biliary at [facility] and found to also have a benign hemangioma.    . Pap smear abnormality of cervix with ASCUS favoring benign 08/20/2015  . Postinflammatory hyperpigmentation 12/12/2013  . Seborrheic dermatitis 03/01/2014    Past Surgical History:  Procedure Laterality Date  . BREAST RECONSTRUCTION Bilateral 2011   Dr Greta Doom in [place]    . COMPLETE MASTECTOMY W/ SENTINEL NODE BIOPSY Bilateral 2010   Right breast DCIS  . CYSTOSCOPY WITH URETHRAL DILATATION N/A 09/15/2017   Procedure: CYSTOSCOPY WITH URETHRAL DILATATION;  Surgeon: Abbie Sons, MD;  Location: [facility] ORS;  Service: Urology;  Laterality: N/A;  . EXCISION MASS ABDOMINAL N/A 12/26/2015   EXCISION MASS ABDOMINAL; CAVERNOUS HEMANGIOMA Seeplaputhur Robinette Haines, MD;  Location: [facility] ORS;  Service: General;  Laterality: N/A;  . KNEE ARTHROSCOPY Right     There were no vitals filed for this visit.    Pelvic Floor Physical Therapy Treatment Note  SCREENING  Changes in medications, allergies, or medical history?: no     SUBJECTIVE  Patient reports: Has had some frequency over the last couple days but just when she was drinking a lot of water at once. She has not had to get up in the middle of the night to pee. She only has hip pain infrequently and it is not as bad as it was, just tight feeling.  Pain update: No pain  currently  Patient Goals: Prevent return of hip/groin pain and decrease urgency/frequency.   OBJECTIVE  Changes in: Posture/Observations:  Patient is more aware but still not activating TA well.  Abdominal:  Difficulty activating TA but capable with greater effort than she feels is necessary.  Gait Analysis: Patient walks with scissoring gait, little abductor activation, feet pronate, IR of the hip and genu valgum. Hyper lordotic curve in the back. She was able to partially correct with cueing but will benefit from more supportive shoe/arch cookies or insoles.  INTERVENTIONS THIS SESSION: Therex: Educated on and practiced hip-flexor stretch in tall  kneeling and prone with strap to allow improved hip flexor length for improved TA recruitment. Educated on and practiced squats with focus on TA and glute activation, knee tracking and weight-shift.  NM re-ed: Focused on recruitment of TA and glute med with ambulation at varying speeds to help patient integrate use of this muscle into functional activity. Similarly Educated on how to activate when on the elyptical with slight forward lean from the hips rather than standing "upright". Educated on how shoe-wear effects muscle activation and how to chose shoes and arch supports to help her engage her glute-med and down-regulate her adductors for decreased PFM tightness and pelvic pain/frequency/urgency.  Total time: 70 min.                         PT Education - 12/25/17 1125    Education provided  Yes    Education Details  See Pt. Instructions and Interventions this session.    Person(s) Educated  Patient    Methods  Explanation;Demonstration;Tactile cues;Verbal cues;Handout    Comprehension  Verbalized understanding;Returned demonstration;Verbal cues required;Tactile cues required       PT Short Term Goals - 12/25/17 1025      PT SHORT TERM GOAL #1   Title  Patient will consistently use squatty-potty at home to decrease resistance for urination and allow more complete emptying of bowel/bladder    Time  5    Period  Weeks    Status  Achieved    Target Date  11/27/17      PT SHORT TERM GOAL #2   Title  Patient will demonstrate improved sitting and standing posture to demonstrate learning and decrease stress on the pelvic floor with functional activity.    Time  5    Period  Weeks    Status  Achieved    Target Date  11/27/17      PT SHORT TERM GOAL #3   Title  Patient will report no feeling of discomfort/pain in the R hip and thigh to demonstrate decreased PFM tension and improved posture.    Time  8    Period  Weeks    Status  New    Target Date  12/18/17         PT Long Term Goals - 11/20/17 1257      PT LONG TERM GOAL #1   Title  Patient will report being able to regulary go 3 hours between trips to urinate to demonstrate decreased urinary frequency and more complete emptying.            Plan - 12/25/17 1126    Clinical Impression Statement  Patient responded well to all interventions and demonstrated understanding of education provided. She has met all her short-term goals and is near meeting her long-term goals as well, she will benefit from further strengthening exercises and potentially more manual  treatment to adductors, back extensors, deep hip flexors, abdominal muscles, and PFM to allow for full relaxation and best recruitment of deep core stabilizers. Continue per POC    Clinical Presentation  Stable    Clinical Decision Making  Moderate    Rehab Potential  Good    Clinical Impairments Affecting Rehab Potential  history of cancer, longstanding high stress    PT Frequency  1x / week    PT Duration  12 weeks    PT Treatment/Interventions  ADLs/Self Care Home Management;Biofeedback;Moist Heat;Traction;Functional mobility training;Gait training;Therapeutic activities;Therapeutic exercise;Balance training;Neuromuscular re-education;Manual techniques;Scar mobilization;Passive range of motion;Dry needling;Taping    PT Next Visit Plan  TP release and MFR with tool to hip flexors and adductors. begin tall-kneel sequence and multi-direction stepping.    PT Home Exercise Plan  Diaphragmatic breathing, squatty potty, 3-way stretch and QL stretch, urge-suppression, TA in mod-quad and pelvic tilts, self posterior fourchette.    Consulted and Agree with Plan of Care  Patient       Patient will benefit from skilled therapeutic intervention in order to improve the following deficits and impairments:  Abnormal gait, Increased fascial restricitons, Improper body mechanics, Pain, Decreased scar mobility, Increased muscle spasms, Postural  dysfunction, Impaired flexibility, Other (comment)  Visit Diagnosis: Other muscle spasm  Abnormal posture  Muscular imbalance  Incomplete bladder emptying  Urinary frequency     Problem List Patient Active Problem List   Diagnosis Date Noted  . Urinary frequency 08/14/2017  . Incomplete bladder emptying 08/14/2017  . Urethral bleeding 08/14/2017  . Hemangioma 08/13/2017  . 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 Right hip pain 04/24/2017  . Atypical glandular cells of undetermined significance (AGUS) on cervical Pap smear 02/23/2017  . Degenerative disc disease, cervical 11/16/2016  . Liver cyst 11/16/2016  . Acute pain of left knee 11/11/2016  . 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 History of abnormal cervical Pap smear 09/14/2015  . History of breast cancer 09/11/2015  . Intramural leiomyoma of uterus 09/11/2015  . 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 Pap smear abnormality of cervix with ASCUS favoring benign 08/20/2015  . Vaginal discharge 08/20/2015  . Abscess of arm, right 03/01/2014  . Seborrheic dermatitis 03/01/2014  . Keloid of skin 12/26/2013  . Acne 12/12/2013  . Postinflammatory hyperpigmentation 12/12/2013  . History of myomectomy 10/12/2013  . Body mass index (BMI) of 28.0-28.9 in adult 10/11/2013  . Dysmenorrhea 10/11/2013  . Fibroids, intramural 10/11/2013  . Personal history of breast cancer 10/11/2013  . Screening for cervical cancer 10/11/2013  . History of blood clots   . Breast cancer ([place])   . Cyst of tendon sheath 09/28/2012   Willa Rough DPT, ATC Willa Rough 12/25/2017, 11:45 AM  [place] MAIN [facility] SERVICES [address] [place], [place], [address] Phone: [phone]   Fax:  [phone]  Name: Beth Branch MRN: [id number] Date of Birth: Jun 27, 1966

## 2018-01-01 ENCOUNTER — Ambulatory Visit: Payer: No Typology Code available for payment source

## 2018-01-01 DIAGNOSIS — M6289 Other specified disorders of muscle: Secondary | ICD-10-CM

## 2018-01-01 DIAGNOSIS — R339 Retention of urine, unspecified: Secondary | ICD-10-CM

## 2018-01-01 DIAGNOSIS — M62838 Other muscle spasm: Secondary | ICD-10-CM | POA: Diagnosis not present

## 2018-01-01 DIAGNOSIS — R293 Abnormal posture: Secondary | ICD-10-CM

## 2018-01-01 DIAGNOSIS — R350 Frequency of micturition: Secondary | ICD-10-CM

## 2018-01-01 DIAGNOSIS — M629 Disorder of muscle, unspecified: Secondary | ICD-10-CM

## 2018-01-01 NOTE — Therapy (Signed)
[place] MAIN [facility] SERVICES [address] [place], [place], [address] Phone: [phone]   Fax:  [phone]  Physical Therapy Treatment  Patient Details  Name: Beth Branch MRN: [id number] Date of Birth: 1966/06/01 Referring Provider: John Giovanni   Encounter Date: 01/01/2018  PT End of Session - 01/01/18 1144    Visit Number  7    Number of Visits  12    Date for PT Re-Evaluation  01/15/18    PT Start Time  74    PT Stop Time  1119    PT Time Calculation (min)  60 min    Activity Tolerance  Patient tolerated treatment well    Behavior During Therapy  [facility] for tasks assessed/performed       Past Medical History:  Diagnosis Date  . Adopted   . BRCA negative 04-05-10  . Breast cancer ([place]) 2010   right breast DCIS  . 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 S/p excision by [facility] 12/18/2015.  Marland Kitchen Complication of anesthesia    REFLUX AFTER ANESTHESIA  . Cyst of tendon sheath 09/28/2012   Overview:  L hand, along the extensor tendons of the long and ring fingers ->[facility] 09/2012. Opted to observe.   . Degenerative disc disease, cervical 11/16/2016   Overview:  Had neck pain ->>Plain films 09/2015 (referred to PT).   . Dysmenorrhea 10/11/2013  . Family history of adverse reaction to anesthesia    adopted  . Fibroids, intramural 10/11/2013  . GERD (gastroesophageal reflux disease)    occ- no meds  . Hemangioma 08/13/2017   Overview:  cavernous hemangioma abdominal wall- removed   . History of blood clots 2011   LEFT ARM-AFTER SURGERY  . Intramural leiomyoma of uterus 09/11/2015   Overview:  Pelvic US 07/2015. Followed by Dr. Kenton Kingfisher - OBGYN.   Marland Kitchen Keloid of skin 12/26/2013  . Liver cyst 11/16/2016   Overview:  Has small benign appearing liver cysts on CT A/P + MRI Liver (2017). Had consult w/ Biliary at [facility] and found to also have a benign hemangioma.    . Pap smear abnormality of cervix with ASCUS favoring benign 08/20/2015  . Postinflammatory hyperpigmentation 12/12/2013  . Seborrheic dermatitis 03/01/2014    Past Surgical History:  Procedure Laterality Date  . BREAST RECONSTRUCTION Bilateral 2011   Dr Greta Doom in [place]    . COMPLETE MASTECTOMY W/ SENTINEL NODE BIOPSY Bilateral 2010   Right breast DCIS  . CYSTOSCOPY WITH URETHRAL DILATATION N/A 09/15/2017   Procedure: CYSTOSCOPY WITH URETHRAL DILATATION;  Surgeon: Abbie Sons, MD;  Location: [facility] ORS;  Service: Urology;  Laterality: N/A;  . EXCISION MASS ABDOMINAL N/A 12/26/2015   EXCISION MASS ABDOMINAL; CAVERNOUS HEMANGIOMA Seeplaputhur Robinette Haines, MD;  Location: [facility] ORS;  Service: General;  Laterality: N/A;  . KNEE ARTHROSCOPY Right     There were no vitals filed for this visit.    Pelvic Floor Physical Therapy Treatment Note  SCREENING  Changes in medications, allergies, or medical history?: no     SUBJECTIVE  Patient reports: She has been urinating every ~1.5 hours over the last couple days but has been able to take deep breaths at night to decrease urge and go back to sleep.  Pain update:  Location of pain: R hip and lower pelvis anteriorly. Current pain:  4/10  Max pain:  4/10 Least pain:  0/10 Nature of pain:  achy with movement, tight.  Pain reduced to 0/10 with treatment today!  Patient Goals: Prevent return of hip/groin pain and decrease urgency/frequency.   OBJECTIVE  Changes in:  Palpation: L>R for myofascial restriction and TP's.  Gait Analysis: antalgic gait on L  INTERVENTIONS THIS SESSION: Manual: MFR with edge tool to B adductors and quads and TP release to multiple TP's to the adductors for decreased spasm and pain and referral if tension/pan into the pelvic floor muscles.   Self-care: educated on the general principles of nutrition and weight loss to help her have less impact on her pelvic floor with impact of gait and  running.   Total time: 60 min.                         PT Education - 01/01/18 1136    Education provided  Yes    Education Details  See Pt. Instructions and Interventions this session.     Person(s) Educated  Patient    Methods  Explanation;Verbal cues    Comprehension  Verbalized understanding       PT Short Term Goals - 12/25/17 1025      PT SHORT TERM GOAL #1   Title  Patient will consistently use squatty-potty at home to decrease resistance for urination and allow more complete emptying of bowel/bladder    Time  5    Period  Weeks    Status  Achieved    Target Date  11/27/17      PT SHORT TERM GOAL #2   Title  Patient will demonstrate improved sitting and standing posture to demonstrate learning and decrease stress on the pelvic floor with functional activity.    Time  5    Period  Weeks    Status  Achieved    Target Date  11/27/17      PT SHORT TERM GOAL #3   Title  Patient will report no feeling of discomfort/pain in the R hip and thigh to demonstrate decreased PFM tension and improved posture.    Time  8    Period  Weeks    Status  New    Target Date  12/18/17        PT Long Term Goals - 11/20/17 1257      PT LONG TERM GOAL #1   Title  Patient will report being able to regulary go 3 hours between trips to urinate to demonstrate decreased urinary frequency and more complete emptying.            Plan - 01/01/18 1145    Clinical Impression Statement  Pt. responded well to treatment today with a reduction in pain from 4/10 to 0/10 and improved gait. Continue per POC.     Clinical Presentation  Stable    Clinical Decision Making  Moderate    Rehab Potential  Good    Clinical Impairments Affecting Rehab Potential  history of cancer, longstanding high stress    PT Frequency  1x / week    PT Duration  12 weeks    PT Treatment/Interventions  ADLs/Self Care Home Management;Biofeedback;Moist Heat;Traction;Functional mobility training;Gait  training;Therapeutic activities;Therapeutic exercise;Balance training;Neuromuscular re-education;Manual techniques;Scar mobilization;Passive range of motion;Dry needling;Taping    PT Next Visit Plan  Further TP release and MFR with tool to L hip flexors and adductors. begin tall-kneel sequence and multi-direction stepping.    PT Home Exercise Plan  Diaphragmatic breathing, squatty potty, 3-way stretch and QL stretch, urge-suppression, TA in  mod-quad and pelvic tilts, self posterior fourchette.    Recommended Other Services  Counseling for stress/grief     Consulted and Agree with Plan of Care  Patient       Patient will benefit from skilled therapeutic intervention in order to improve the following deficits and impairments:  Abnormal gait, Increased fascial restricitons, Improper body mechanics, Pain, Decreased scar mobility, Increased muscle spasms, Postural dysfunction, Impaired flexibility, Other (comment)  Visit Diagnosis: Other muscle spasm  Abnormal posture  Muscular imbalance  Incomplete bladder emptying  Urinary frequency     Problem List Patient Active Problem List   Diagnosis Date Noted  . Urinary frequency 08/14/2017  . Incomplete bladder emptying 08/14/2017  . Urethral bleeding 08/14/2017  . Hemangioma 08/13/2017  . 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 Right hip pain 04/24/2017  . Atypical glandular cells of undetermined significance (AGUS) on cervical Pap smear 02/23/2017  . Degenerative disc disease, cervical 11/16/2016  . Liver cyst 11/16/2016  . Acute pain of left knee 11/11/2016  . 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 History of abnormal cervical Pap smear 09/14/2015  . History of breast cancer 09/11/2015  . Intramural leiomyoma of uterus 09/11/2015  . 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 Pap smear abnormality of cervix with ASCUS favoring benign 08/20/2015  . Vaginal  discharge 08/20/2015  . Abscess of arm, right 03/01/2014  . Seborrheic dermatitis 03/01/2014  . Keloid of skin 12/26/2013  . Acne 12/12/2013  . Postinflammatory hyperpigmentation 12/12/2013  . History of myomectomy 10/12/2013  . Body mass index (BMI) of 28.0-28.9 in adult 10/11/2013  . Dysmenorrhea 10/11/2013  . Fibroids, intramural 10/11/2013  . Personal history of breast cancer 10/11/2013  . Screening for cervical cancer 10/11/2013  . History of blood clots   . Breast cancer ([place])   . Cyst of tendon sheath 09/28/2012   Willa Rough DPT, ATC Willa Rough 01/01/2018, 1:11 PM  [place] MAIN [facility] SERVICES [address] [place], [place], [address] Phone: [phone]   Fax:  [phone]  Name: Beth Branch MRN: [id number] Date of Birth: [birth year]

## 2018-01-08 ENCOUNTER — Ambulatory Visit: Payer: No Typology Code available for payment source

## 2018-01-08 DIAGNOSIS — M629 Disorder of muscle, unspecified: Secondary | ICD-10-CM

## 2018-01-08 DIAGNOSIS — M62838 Other muscle spasm: Secondary | ICD-10-CM

## 2018-01-08 DIAGNOSIS — R293 Abnormal posture: Secondary | ICD-10-CM

## 2018-01-08 DIAGNOSIS — R350 Frequency of micturition: Secondary | ICD-10-CM

## 2018-01-08 DIAGNOSIS — M6289 Other specified disorders of muscle: Secondary | ICD-10-CM

## 2018-01-08 DIAGNOSIS — R339 Retention of urine, unspecified: Secondary | ICD-10-CM

## 2018-01-08 NOTE — Therapy (Addendum)
[place] [facility]IN [facility] SERVICES [address] [place], [place], [address] Phone: [phone]   Fax:  [phone]  Physical Therapy Treatment  Patient Details  Name: Beth Branch MRN: [id number] Date of Birth: [birth year] Referring Provider: John Giovanni   Encounter Date: 01/08/2018  PT End of Session - 01/12/18 1323    Visit Number  7    Number of Visits  12    Date for PT Re-Evaluation  01/15/18    PT Start Time  1018    PT Stop Time  1118    PT Time Calculation (min)  60 min    Activity Tolerance  Patient tolerated treatment well    Behavior During Therapy  [facility] for tasks assessed/performed       Past Medical History:  Diagnosis Date  . Adopted   . BRCA negative 04-05-10  . 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 S/p excision by [facility] 12/18/2015.  Marland Kitchen Complication of anesthesia    REFLUX AFTER ANESTHESIA  . Cyst of tendon sheath 09/28/2012   Overview:  L hand, along the extensor tendons of the long and ring fingers ->[facility] 09/2012. Opted to observe.   . Degenerative disc disease, cervical 11/16/2016   Overview:  Had neck pain ->>Plain films 09/2015 (referred to PT).   . Dysmenorrhea 10/11/2013  . Family history of adverse reaction to anesthesia    adopted  . Fibroids, intramural 10/11/2013  . GERD (gastroesophageal reflux disease)    occ- no meds  . Hemangioma 08/13/2017   Overview:  cavernous hemangioma abdominal wall- removed   . History of blood clots 2011   LEFT ARM-AFTER SURGERY  . Intramural leiomyoma of uterus 09/11/2015   Overview:  Pelvic US 07/2015. Followed by Dr. Kenton Kingfisher - OBGYN.   Marland Kitchen Keloid of skin 12/26/2013  . Liver cyst 11/16/2016   Overview:  Has small benign appearing liver cysts on CT A/P + MRI Liver (2017). Had consult w/ Biliary at [facility] and found to also have a benign hemangioma.    . Pap smear abnormality of cervix with ASCUS favoring benign 08/20/2015  . Postinflammatory hyperpigmentation 12/12/2013  . Seborrheic dermatitis 03/01/2014    Past Surgical History:  Procedure Laterality Date  . BREAST RECONSTRUCTION Bilateral 2011   Dr Greta Doom in [place]    . COMPLETE MASTECTOMY W/ SENTINEL NODE BIOPSY Bilateral 2010   Right breast DCIS  . CYSTOSCOPY WITH URETHRAL DILATATION N/A 09/15/2017   Procedure: CYSTOSCOPY WITH URETHRAL DILATATION;  Surgeon: Abbie Sons, MD;  Location: [facility] ORS;  Service: Urology;  Laterality: N/A;  . EXCISION MASS ABDOMINAL N/A 12/26/2015   EXCISION MASS ABDOMINAL; CAVERNOUS HEMANGIOMA Seeplaputhur Robinette [place], MD;  Location: [facility] ORS;  Service: General;  Laterality: N/A;  . KNEE ARTHROSCOPY Right     There were no vitals filed for this visit.    Pelvic Floor Physical Therapy Treatment Note  SCREENING  Changes in medications, allergies, or medical history?: Started taking 20 mg of Protonix to address pain and feeling of lump in throat.   SUBJECTIVE  Patient reports: She is having back pain today that is similar to a pain she has had in the past that ended up being due to a benign tumor (but pain persisted) it eventually went away and so she attributed it to pain. She is now having this problem  again she has acid reflux and can "feel the food going down the esophagus" "bad feeling until digestion is complete. Has to go back to [facility] to determine if it is digestive or hepatic.    Her life is still stressful but no new stress.   Precautions:  Cancer  Pain update:  Location of pain: Pain in her R neck and shoulder, R hip  Current pain:  4/10  Max pain:  5/10 Least pain:  3/10 Nature of pain: dull ache  Patient Goals: Prevent return of hip/groin pain and decrease urgency/frequency.    OBJECTIVE   INTERVENTIONS THIS SESSION: Self-care: discussed getting a pillow that will support her head effectively for  side-sleeping to improve her upper back pain. Discussed using headspace app to help decrease her stress level to benefit both her acid reflux and her pelvic floor tightness. Encoraged Pt. To be consistent with Dr.'s recommendation of gaviscon to allow the tissue time to heal while also implementing stress reduction.  Therex: educated on the potential risk of performing yoga (inversion) while having a gerd flare-up and how to use neutral pelvic tilt to protect her and strengthen appropriately if she takes up pure barre. NM re-ed: Educated on tall kneeling with PFM and TA activation for coordination of the two that will transition into static stability as well as multi-directional stepping to do the same for stability in dynamic movements.   Total time: 60 min.                         PT Education - 01/12/18 1323    Education provided  Yes    Education Details  See Pt. Instructions and interventions this session    Person(s) Educated  Patient    Methods  Explanation;Demonstration;Tactile cues;Verbal cues;Handout    Comprehension  Verbalized understanding;Returned demonstration;Need further instruction       PT Short Term Goals - 12/25/17 1025      PT SHORT TERM GOAL #1   Title  Patient will consistently use squatty-potty at home to decrease resistance for urination and allow more complete emptying of bowel/bladder    Time  5    Period  Weeks    Status  Achieved    Target Date  11/27/17      PT SHORT TERM GOAL #2   Title  Patient will demonstrate improved sitting and standing posture to demonstrate learning and decrease stress on the pelvic floor with functional activity.    Time  5    Period  Weeks    Status  Achieved    Target Date  11/27/17      PT SHORT TERM GOAL #3   Title  Patient will report no feeling of discomfort/pain in the R hip and thigh to demonstrate decreased PFM tension and improved posture.    Time  8    Period  Weeks    Status  New    Target  Date  12/18/17        PT Long Term Goals - 11/20/17 1257      PT LONG TERM GOAL #1   Title  Patient will report being able to regulary go 3 hours between trips to urinate to demonstrate decreased urinary frequency and more complete emptying.            Plan - 01/12/18 1324    Clinical Impression Statement  Pt. responded well to all intervention today and demonstratedunderstanding of all education but will benefit  from review of multi-direction steping. Continue per POC.    Clinical Presentation  Stable    Clinical Decision Making  Moderate    Rehab Potential  Good    Clinical Impairments Affecting Rehab Potential  history of cancer, longstanding high stress    PT Frequency  1x / week    PT Duration  12 weeks    PT Treatment/Interventions  ADLs/Self Care Home Management;Biofeedback;Moist Heat;Traction;Functional mobility training;Gait training;Therapeutic activities;Therapeutic exercise;Balance training;Neuromuscular re-education;Manual techniques;Scar mobilization;Passive range of motion;Dry needling;Taping    PT Next Visit Plan  Further TP release and MFR with tool to L hip flexors and adductors. review multi-direction stepping.    PT Home Exercise Plan  Diaphragmatic breathing, squatty potty, 3-way stretch and QL stretch, urge-suppression, TA in mod-quad and pelvic tilts, self posterior fourchette, multi-direction stepping, tall kneeling, child's pose, mini-marches, extension over foam-roller    Recommended Other Services  headspace for mindfulness or counselling    Consulted and Agree with Plan of Care  Patient       Patient will benefit from skilled therapeutic intervention in order to improve the following deficits and impairments:  Abnormal gait, Increased fascial restricitons, Improper body mechanics, Pain, Decreased scar mobility, Increased muscle spasms, Postural dysfunction, Impaired flexibility, Other (comment)  Visit Diagnosis: Other muscle spasm  Abnormal  posture  Muscular imbalance  Incomplete bladder emptying  Urinary frequency     Problem List Patient Active Problem List   Diagnosis Date Noted  . Urinary frequency 08/14/2017  . Incomplete bladder emptying 08/14/2017  . Urethral bleeding 08/14/2017  . Hemangioma 08/13/2017  . 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 Right hip pain 04/24/2017  . Atypical glandular cells of undetermined significance (AGUS) on cervical Pap smear 02/23/2017  . Degenerative disc disease, cervical 11/16/2016  . Liver cyst 11/16/2016  . Acute pain of left knee 11/11/2016  . 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 History of abnormal cervical Pap smear 09/14/2015  . History of breast cancer 09/11/2015  . Intramural leiomyoma of uterus 09/11/2015  . 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 Pap smear abnormality of cervix with ASCUS favoring benign 08/20/2015  . Vaginal discharge 08/20/2015  . Abscess of arm, right 03/01/2014  . Seborrheic dermatitis 03/01/2014  . Keloid of skin 12/26/2013  . Acne 12/12/2013  . Postinflammatory hyperpigmentation 12/12/2013  . History of myomectomy 10/12/2013  . Body mass index (BMI) of 28.0-28.9 in adult 10/11/2013  . Dysmenorrhea 10/11/2013  . Fibroids, intramural 10/11/2013  . Personal history of breast cancer 10/11/2013  . Screening for cervical cancer 10/11/2013  . History of blood clots   . Breast cancer ([place])   . Cyst of tendon sheath 09/28/2012   Willa Rough DPT, ATC Willa Rough 01/12/2018, 1:28 PM  [place] MAIN [facility] SERVICES [address] [place], [place], [address] Phone: [phone]   Fax:  [phone]  Name: Beth Branch MRN: [id number] Date of Birth: 1965-12-21

## 2018-01-08 NOTE — Patient Instructions (Signed)
1) look into the headspace app.  2) Yerba Mate tea has a higher caffeine percentage and can help you stay regular if you have issues as you start to cut out caffeine.   3) make sure that your pillow is big enough to support your head is supported well in a neutral position not flexed. Check out the ones that have memory foam pieces rather than solid memory foam.   4)   Place foam roller or towel under your upper back between your shoulder blades. Support your head with your hands, elbows forward, and gently rock back and forth and side to side to improve motion in your back.   Move the foam roller or towel up and down to a few spots in the upper back, repeating the process.    5) if you are doing barre or yoga (eventually) make sure that you are keeping your pelvic neutral position regardless of what the instructor tells you.  6)Mini-Marches    Exhale, drawing the lower tummy (TA) in toward the back bone and hold contraction while you lift one foot ~ 2 inches off the mat, then the other foot before relaxing and resetting. Try to keep your hips from rocking, using your hands to sense whether they are staying even as pictured.      Perform _10__ repetitions for _3__ sets. Do this _1_ times per day.    Activate and draw up the pelvic floor and tuck pelvis slightly under by squeezing the lower tummy muscles and glutes. Hold up to 10 seconds, resting when the PF becomes tired. Repeat _5__ times, __1__ times per day.  Child's Pose Pelvic Floor Lengthening    Sit in knee-chest position and reach arms forward. Separate knees for comfort. Hold position for _5__ breaths in-between your tall kneeling holds.        7)   Stand with equal weight on both feet engage the pelvic floor and lower abdomen and then lunge with the same leg 5 times in each direction, keeping foot forward and balancing for a second before placing the foot down between repetitions.  __3_ reps __1_ times per  day.

## 2018-01-15 ENCOUNTER — Ambulatory Visit: Payer: No Typology Code available for payment source

## 2018-01-19 ENCOUNTER — Other Ambulatory Visit: Payer: Self-pay | Admitting: Acute Care

## 2018-01-19 DIAGNOSIS — D1800 Hemangioma unspecified site: Secondary | ICD-10-CM

## 2018-01-24 ENCOUNTER — Ambulatory Visit
Admission: RE | Admit: 2018-01-24 | Discharge: 2018-01-24 | Disposition: A | Discharge status: Home / Self Care | Payer: No Typology Code available for payment source | Source: Ambulatory Visit | Attending: Acute Care | Admitting: Acute Care

## 2018-01-24 DIAGNOSIS — D1800 Hemangioma unspecified site: Secondary | ICD-10-CM

## 2018-01-24 MED ORDER — GADOBENATE DIMEGLUMINE 529 MG/ML IV SOLN
17.0000 mL | Freq: Once | INTRAVENOUS | Status: AC | PRN
  Administered 2018-01-24: 17 mL via INTRAVENOUS

## 2018-01-25 ENCOUNTER — Ambulatory Visit: Payer: No Typology Code available for payment source | Attending: Urology

## 2018-01-25 DIAGNOSIS — M62838 Other muscle spasm: Secondary | ICD-10-CM | POA: Diagnosis present

## 2018-01-25 DIAGNOSIS — M629 Disorder of muscle, unspecified: Secondary | ICD-10-CM | POA: Diagnosis present

## 2018-01-25 DIAGNOSIS — M6289 Other specified disorders of muscle: Secondary | ICD-10-CM

## 2018-01-25 DIAGNOSIS — R350 Frequency of micturition: Secondary | ICD-10-CM

## 2018-01-25 DIAGNOSIS — R293 Abnormal posture: Secondary | ICD-10-CM | POA: Diagnosis present

## 2018-01-25 DIAGNOSIS — R339 Retention of urine, unspecified: Secondary | ICD-10-CM | POA: Diagnosis present

## 2018-01-25 NOTE — Therapy (Signed)
[facility] Branch [facility] SERVICES [address] [place], [place], [address] Phone: [phone]   Fax:  [phone]  Physical Therapy Treatment  Patient Details  Name: Beth Branch MRN: [id number] Date of Birth: 1965/12/16 Referring Provider: John Giovanni   Encounter Date: 01/25/2018  PT End of Session - 01/25/18 2229    Visit Number  8    Number of Visits  12    Date for PT Re-Evaluation  01/15/18    PT Start Time  4373    PT Stop Time  1500    PT Time Calculation (min)  55 min    Activity Tolerance  Patient tolerated treatment well    Behavior During Therapy  [facility] for tasks assessed/performed       Past Medical History:  Diagnosis Date  . Adopted   . BRCA negative 04-05-10  . 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 S/p excision by [facility] 12/18/2015.  Marland Kitchen Complication of anesthesia    REFLUX AFTER ANESTHESIA  . Cyst of tendon sheath 09/28/2012   Overview:  L hand, along the extensor tendons of the long and ring fingers ->[facility] 09/2012. Opted to observe.   . Degenerative disc disease, cervical 11/16/2016   Overview:  Had neck pain ->>Plain films 09/2015 (referred to PT).   . Dysmenorrhea 10/11/2013  . Family history of adverse reaction to anesthesia    adopted  . Fibroids, intramural 10/11/2013  . GERD (gastroesophageal reflux disease)    occ- no meds  . Hemangioma 08/13/2017   Overview:  cavernous hemangioma abdominal wall- removed   . History of blood clots 2011   LEFT ARM-AFTER SURGERY  . Intramural leiomyoma of uterus 09/11/2015   Overview:  Pelvic US 07/2015. Followed by Dr. Kenton Kingfisher - OBGYN.   Marland Kitchen Keloid of skin 12/26/2013  . Liver cyst 11/16/2016   Overview:  Has small benign appearing liver cysts on CT A/P + MRI Liver (2017). Had consult w/ Biliary at [facility] and found to also have a benign hemangioma.    . Pap smear abnormality of cervix with ASCUS favoring benign 08/20/2015  . Postinflammatory hyperpigmentation 12/12/2013  . Seborrheic dermatitis 03/01/2014    Past Surgical History:  Procedure Laterality Date  . BREAST RECONSTRUCTION Bilateral 2011   Dr Greta Doom in [place]    . COMPLETE MASTECTOMY W/ SENTINEL NODE BIOPSY Bilateral 2010   Right breast DCIS  . CYSTOSCOPY WITH URETHRAL DILATATION N/A 09/15/2017   Procedure: CYSTOSCOPY WITH URETHRAL DILATATION;  Surgeon: Abbie Sons, MD;  Location: [facility] ORS;  Service: Urology;  Laterality: N/A;  . EXCISION MASS ABDOMINAL N/A 12/26/2015   EXCISION MASS ABDOMINAL; CAVERNOUS HEMANGIOMA Seeplaputhur Robinette [place], MD;  Location: [facility] ORS;  Service: General;  Laterality: N/A;  . KNEE ARTHROSCOPY Right     There were no vitals filed for this visit.    Pelvic Floor Physical Therapy Treatment Note  SCREENING  Changes in medications, allergies, or medical history?: Upped Protonix and has taken gaviscon fairly consistently.      SUBJECTIVE  Patient reports: She has still been having upper GI issues. Today she seems to be having more frequency today for some reason.   Precautions:  Cancer  Pain update:  Location of pain: R hip. Current pain:  0/10  Max pain:  2-3/10 Least pain:  0/10 Nature of pain: achy  Patient Goals: Prevent return of hip/groin pain and decrease urgency/frequency.    OBJECTIVE  Changes in:  Palpation: TTP through R qbliques, QL, TFL, and glute-med  INTERVENTIONS THIS SESSION: Manual: TP release to R obliques, QL, TFL, and glute-med for decreased hip pain and decreased pressure nerves coursing to the PFM and bladder for decreased frequency. Self-care: continued to discuss the importance of addressing anxiety and stress to maintain symptom improvement, re-iterated how headspace app could help her with mindfulness.   Total time: 55 min.                          PT  Education - 01/25/18 2229    Education provided  Yes    Education Details  See Interventions this session    Person(s) Educated  Patient    Methods  Explanation    Comprehension  Verbalized understanding       PT Short Term Goals - 12/25/17 1025      PT SHORT TERM GOAL #1   Title  Patient will consistently use squatty-potty at home to decrease resistance for urination and allow more complete emptying of bowel/bladder    Time  5    Period  Weeks    Status  Achieved    Target Date  11/27/17      PT SHORT TERM GOAL #2   Title  Patient will demonstrate improved sitting and standing posture to demonstrate learning and decrease stress on the pelvic floor with functional activity.    Time  5    Period  Weeks    Status  Achieved    Target Date  11/27/17      PT SHORT TERM GOAL #3   Title  Patient will report no feeling of discomfort/pain in the R hip and thigh to demonstrate decreased PFM tension and improved posture.    Time  8    Period  Weeks    Status  New    Target Date  12/18/17        PT Long Term Goals - 11/20/17 1257      PT LONG TERM GOAL #1   Title  Patient will report being able to regulary go 3 hours between trips to urinate to demonstrate decreased urinary frequency and more complete emptying.            Plan - 01/25/18 2230    Clinical Impression Statement  Pt. responded well to treatment today demonstrating decreased spasms and no increased pain. Continue per POC.    Clinical Presentation  Stable    Clinical Decision Making  Moderate    Rehab Potential  Good    Clinical Impairments Affecting Rehab Potential  history of cancer, longstanding high stress    PT Frequency  1x / week    PT Duration  12 weeks    PT Treatment/Interventions  ADLs/Self Care Home Management;Biofeedback;Moist Heat;Traction;Functional mobility training;Gait training;Therapeutic activities;Therapeutic exercise;Balance training;Neuromuscular re-education;Manual techniques;Scar  mobilization;Passive range of motion;Dry needling;Taping    PT Next Visit Plan  Further TP release and MFR with tool to L hip flexors and adductors. review multi-direction stepping.    PT Home Exercise Plan  Diaphragmatic breathing, squatty potty, 3-way stretch and QL stretch, urge-suppression, TA in mod-quad and pelvic tilts, self posterior fourchette, multi-direction stepping, tall kneeling, child's pose, mini-marches, extension over foam-roller    Consulted and Agree with Plan of Care  Patient       Patient will benefit from skilled therapeutic  intervention in order to improve the following deficits and impairments:  Abnormal gait, Increased fascial restricitons, Improper body mechanics, Pain, Decreased scar mobility, Increased muscle spasms, Postural dysfunction, Impaired flexibility, Other (comment)  Visit Diagnosis: Other muscle spasm  Abnormal posture  Muscular imbalance  Incomplete bladder emptying  Urinary frequency     Problem List Patient Active Problem List   Diagnosis Date Noted  . Urinary frequency 08/14/2017  . Incomplete bladder emptying 08/14/2017  . Urethral bleeding 08/14/2017  . Hemangioma 08/13/2017  . 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 Right hip pain 04/24/2017  . Atypical glandular cells of undetermined significance (AGUS) on cervical Pap smear 02/23/2017  . Degenerative disc disease, cervical 11/16/2016  . Liver cyst 11/16/2016  . Acute pain of left knee 11/11/2016  . 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 History of abnormal cervical Pap smear 09/14/2015  . History of breast cancer 09/11/2015  . Intramural leiomyoma of uterus 09/11/2015  . 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 Pap smear abnormality of cervix with ASCUS favoring benign 08/20/2015  . Vaginal discharge 08/20/2015  . Abscess of arm, right 03/01/2014  . Seborrheic dermatitis 03/01/2014  . Keloid of skin  12/26/2013  . Acne 12/12/2013  . Postinflammatory hyperpigmentation 12/12/2013  . History of myomectomy 10/12/2013  . Body mass index (BMI) of 28.0-28.9 in adult 10/11/2013  . Dysmenorrhea 10/11/2013  . Fibroids, intramural 10/11/2013  . Personal history of breast cancer 10/11/2013  . Screening for cervical cancer 10/11/2013  . History of blood clots   . Breast cancer ([place])   . Cyst of tendon sheath 09/28/2012   Willa Rough DPT, ATC Willa Rough 01/25/2018, 10:33 PM  [place] [facility] SERVICES [address] [place], [place], [address] Phone: [phone]   Fax:  [phone]  Name: Beth Branch MRN: [id number] Date of Birth: 11/14/1965

## 2018-01-29 ENCOUNTER — Encounter: Payer: Self-pay | Admitting: *Deleted

## 2018-02-03 DIAGNOSIS — K219 Gastro-esophageal reflux disease without esophagitis: Secondary | ICD-10-CM | POA: Insufficient documentation

## 2018-02-05 ENCOUNTER — Ambulatory Visit: Payer: No Typology Code available for payment source

## 2018-02-15 ENCOUNTER — Ambulatory Visit: Payer: No Typology Code available for payment source

## 2018-02-15 DIAGNOSIS — R293 Abnormal posture: Secondary | ICD-10-CM

## 2018-02-15 DIAGNOSIS — R350 Frequency of micturition: Secondary | ICD-10-CM

## 2018-02-15 DIAGNOSIS — R339 Retention of urine, unspecified: Secondary | ICD-10-CM

## 2018-02-15 DIAGNOSIS — M62838 Other muscle spasm: Secondary | ICD-10-CM | POA: Diagnosis not present

## 2018-02-15 DIAGNOSIS — M629 Disorder of muscle, unspecified: Secondary | ICD-10-CM

## 2018-02-15 DIAGNOSIS — M6289 Other specified disorders of muscle: Secondary | ICD-10-CM

## 2018-02-15 NOTE — Patient Instructions (Signed)
1) Look into getting a theracane to help you do trigger point release to your upper shoulder on the Left side.  2) Gently rotate to your left to the point of discomfort and pulse slowly on and off of it for ~ 1 min per day.

## 2018-02-15 NOTE — Therapy (Signed)
[place] MAIN [facility] SERVICES [address] [place], [place], [address] Phone: [phone]   Fax:  [phone]  Physical Therapy Treatment  Patient Details  Name: Beth Branch MRN: [id number] Date of Birth: 09-May-1966 Referring Provider: John Giovanni   Encounter Date: 02/15/2018  PT End of Session - 02/15/18 1521    Visit Number  9    Number of Visits  12    Date for PT Re-Evaluation  01/15/18    PT Start Time  6811    PT Stop Time  1500    PT Time Calculation (min)  55 min    Activity Tolerance  Patient tolerated treatment well;No increased pain    Behavior During Therapy  WFL for tasks assessed/performed       Past Medical History:  Diagnosis Date  . Adopted   . BRCA negative 04-05-10  . 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 S/p excision by [facility] 12/18/2015.  Marland Kitchen Complication of anesthesia    REFLUX AFTER ANESTHESIA  . Cyst of tendon sheath 09/28/2012   Overview:  L hand, along the extensor tendons of the long and ring fingers ->[facility] 09/2012. Opted to observe.   . Degenerative disc disease, cervical 11/16/2016   Overview:  Had neck pain ->>Plain films 09/2015 (referred to PT).   . Dysmenorrhea 10/11/2013  . Family history of adverse reaction to anesthesia    adopted  . Fibroids, intramural 10/11/2013  . GERD (gastroesophageal reflux disease)    occ- no meds  . Hemangioma 08/13/2017   Overview:  cavernous hemangioma abdominal wall- removed   . History of blood clots 2011   LEFT ARM-AFTER SURGERY  . Intramural leiomyoma of uterus 09/11/2015   Overview:  Pelvic US 07/2015. Followed by Dr. Kenton Kingfisher - OBGYN.   Marland Kitchen Keloid of skin 12/26/2013  . Liver cyst 11/16/2016   Overview:  Has small benign appearing liver cysts on CT A/P + MRI Liver (2017). Had consult w/ Biliary at [facility] and found to also have a  benign hemangioma.   . Pap smear abnormality of cervix with ASCUS favoring benign 08/20/2015  . Postinflammatory hyperpigmentation 12/12/2013  . Seborrheic dermatitis 03/01/2014    Past Surgical History:  Procedure Laterality Date  . BREAST RECONSTRUCTION Bilateral 2011   Dr Greta Doom in [place]    . COMPLETE MASTECTOMY W/ SENTINEL NODE BIOPSY Bilateral 2010   Right breast DCIS  . CYSTOSCOPY WITH URETHRAL DILATATION N/A 09/15/2017   Procedure: CYSTOSCOPY WITH URETHRAL DILATATION;  Surgeon: Abbie Sons, MD;  Location: [facility] ORS;  Service: Urology;  Laterality: N/A;  . EXCISION MASS ABDOMINAL N/A 12/26/2015   EXCISION MASS ABDOMINAL; CAVERNOUS HEMANGIOMA Seeplaputhur Robinette Haines, MD;  Location: [facility] ORS;  Service: General;  Laterality: N/A;  . KNEE ARTHROSCOPY Right     There were no vitals filed for this visit.    Pelvic Floor Physical Therapy Treatment Note  SCREENING  Changes in medications, allergies, or medical history?: no     SUBJECTIVE  Patient reports: She had some soreness in R hip from exercise but it went away. Mild increased frequency from iced tea intake. Her MD said that she is making progress, her residual volume is down and she is not worried about the remaining amount. The lump in the throat feeling is less over the last couple days and she  has not been using the wedge to sleep because it was making her neck hurt.  Precautions:  Hx. Of cancer  Pain update: Neck pain, "achy and tight"   No pain at rest following treatment today.  Patient Goals: Prevent return of hip/groin pain and decrease urgency/frequency.    OBJECTIVE  Changes in:  Range of Motion/Flexibilty:  50-% decreased L rotation neck and R side-bending. Able to get 100% of opposite side ROM for side-bending following treatment and lacking ~ 10% of L rotation still at end of session.  Palpation: Facet restriction with L rotation, increased tension in Scalenes and SCM limiting  side-bending and rotation. PA to C2 re-created Pt's complaint of "lump in throat" and R ear discomfort and was reduced following manual treatment.   INTERVENTIONS THIS SESSION: Manual: C2 and C4 PA mobilization, R SCM TP release, B scalene release, L rotation grade 3 gapping rotation mobilization, and suboccipital release to improve cervical alignment for decreased restriction of neurological transmissions to the rest of the body.    Therex: educated Pt on self mobilizations to continue to decrease facet restriction on L and how to use a theracane to help decrease L upper trapezius and other neck/shoulder trigger points for improved posture and decreased neck pain/neurological impact.  Total time: 55 min.                          PT Education - 02/15/18 1521    Education provided  Yes    Education Details  See Interventions this session    Person(s) Educated  Patient    Methods  Explanation;Demonstration;Tactile cues;Verbal cues;Handout    Comprehension  Verbalized understanding;Returned demonstration;Verbal cues required;Tactile cues required       PT Short Term Goals - 12/25/17 1025      PT SHORT TERM GOAL #1   Title  Patient will consistently use squatty-potty at home to decrease resistance for urination and allow more complete emptying of bowel/bladder    Time  5    Period  Weeks    Status  Achieved    Target Date  11/27/17      PT SHORT TERM GOAL #2   Title  Patient will demonstrate improved sitting and standing posture to demonstrate learning and decrease stress on the pelvic floor with functional activity.    Time  5    Period  Weeks    Status  Achieved    Target Date  11/27/17      PT SHORT TERM GOAL #3   Title  Patient will report no feeling of discomfort/pain in the R hip and thigh to demonstrate decreased PFM tension and improved posture.    Time  8    Period  Weeks    Status  New    Target Date  12/18/17        PT Long Term Goals -  11/20/17 1257      PT LONG TERM GOAL #1   Title  Patient will report being able to regulary go 3 hours between trips to urinate to demonstrate decreased urinary frequency and more complete emptying.            Plan - 02/15/18 1524    Clinical Impression Statement  Pt. responded well to treatment today, demonstrating improved neck mobility and decreased "lump in throat" sensation following treatment. The neck was treated today to decrease pressure on neural structures that innervate the pelvis and back for further symptome reduction.  THe "lump in throat" sensation was used as part of the test-retest because in demonstrated improved neurological flow/decreased compression. Pt. did also not pain radiating into the SIJ and pelvic region while TP release was performed to the scalenes B, further supporting the rediculopathic model. Pt. will continue to benefit from skilled PT to further address her urinary frequency/urgency and increased pelvic floor tone as well as her posture and movement patterns. Continue per POC.    Clinical Presentation  Stable    Clinical Decision Making  Moderate    Rehab Potential  Good    Clinical Impairments Affecting Rehab Potential  history of cancer, longstanding high stress    PT Frequency  1x / week    PT Duration  12 weeks    PT Treatment/Interventions  ADLs/Self Care Home Management;Biofeedback;Moist Heat;Traction;Functional mobility training;Gait training;Therapeutic activities;Therapeutic exercise;Balance training;Neuromuscular re-education;Manual techniques;Scar mobilization;Passive range of motion;Dry needling;Taping    PT Next Visit Plan  re-assess neck, TP release to B trapezius and pectoralis as well as PA mobs to ~C7-T6. Re-check sacral mobility as well.    PT Home Exercise Plan  Diaphragmatic breathing, squatty potty, 3-way stretch and QL stretch, urge-suppression, TA in mod-quad and pelvic tilts, self posterior fourchette, multi-direction stepping, tall  kneeling, child's pose, mini-marches, extension over foam-roller, theracane for upper traps, L rotation mobs for facet syndrome.    Consulted and Agree with Plan of Care  Patient       Patient will benefit from skilled therapeutic intervention in order to improve the following deficits and impairments:  Abnormal gait, Increased fascial restricitons, Improper body mechanics, Pain, Decreased scar mobility, Increased muscle spasms, Postural dysfunction, Impaired flexibility, Other (comment)  Visit Diagnosis: Other muscle spasm  Abnormal posture  Muscular imbalance  Incomplete bladder emptying  Urinary frequency     Problem List Patient Active Problem List   Diagnosis Date Noted  . Urinary frequency 08/14/2017  . Incomplete bladder emptying 08/14/2017  . Urethral bleeding 08/14/2017  . Hemangioma 08/13/2017  . 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 Right hip pain 04/24/2017  . Atypical glandular cells of undetermined significance (AGUS) on cervical Pap smear 02/23/2017  . Degenerative disc disease, cervical 11/16/2016  . Liver cyst 11/16/2016  . Acute pain of left knee 11/11/2016  . 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 History of abnormal cervical Pap smear 09/14/2015  . History of breast cancer 09/11/2015  . Intramural leiomyoma of uterus 09/11/2015  . 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 Pap smear abnormality of cervix with ASCUS favoring benign 08/20/2015  . Vaginal discharge 08/20/2015  . Abscess of arm, right 03/01/2014  . Seborrheic dermatitis 03/01/2014  . Keloid of skin 12/26/2013  . Acne 12/12/2013  . Postinflammatory hyperpigmentation 12/12/2013  . History of myomectomy 10/12/2013  . Body mass index (BMI) of 28.0-28.9 in adult 10/11/2013  . Dysmenorrhea 10/11/2013  . Fibroids, intramural 10/11/2013  . Personal history of breast cancer 10/11/2013  . Screening for cervical cancer  10/11/2013  . History of blood clots   . Breast cancer ([place])   . Cyst of tendon sheath 09/28/2012   Willa Rough DPT, ATC Willa Rough 02/15/2018, 3:32 PM  [place] MAIN [facility] SERVICES [address] [place], [place], [address] Phone: [phone]   Fax:  [phone]  Name: Beth Branch MRN: [id number] Date of Birth: 1966/01/31

## 2018-03-05 ENCOUNTER — Ambulatory Visit: Payer: No Typology Code available for payment source | Attending: Urology

## 2018-03-05 DIAGNOSIS — M629 Disorder of muscle, unspecified: Secondary | ICD-10-CM | POA: Diagnosis present

## 2018-03-05 DIAGNOSIS — R339 Retention of urine, unspecified: Secondary | ICD-10-CM | POA: Diagnosis present

## 2018-03-05 DIAGNOSIS — R350 Frequency of micturition: Secondary | ICD-10-CM | POA: Insufficient documentation

## 2018-03-05 DIAGNOSIS — M6289 Other specified disorders of muscle: Secondary | ICD-10-CM

## 2018-03-05 DIAGNOSIS — M62838 Other muscle spasm: Secondary | ICD-10-CM | POA: Insufficient documentation

## 2018-03-05 DIAGNOSIS — R293 Abnormal posture: Secondary | ICD-10-CM | POA: Diagnosis present

## 2018-03-05 NOTE — Therapy (Signed)
[place] MAIN [facility] SERVICES [address] [place], [place], [address] Phone: [phone]   Fax:  [phone]  Physical Therapy Treatment  Patient Details  Name: Beth Branch MRN: [id number] Date of Birth: [birth year] Referring Provider: John Giovanni   Encounter Date: 03/05/2018  PT End of Session - 03/05/18 2117    Visit Number  10    Number of Visits  12    Date for PT Re-Evaluation  01/15/18    PT Start Time  1104    PT Stop Time  1204    PT Time Calculation (min)  60 min    Activity Tolerance  Patient tolerated treatment well;No increased pain    Behavior During Therapy  WFL for tasks assessed/performed       Past Medical History:  Diagnosis Date  . Adopted   . BRCA negative 04-05-10  . 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 S/p excision by [facility] 12/18/2015.  Marland Kitchen Complication of anesthesia    REFLUX AFTER ANESTHESIA  . Cyst of tendon sheath 09/28/2012   Overview:  L hand, along the extensor tendons of the long and ring fingers ->[facility] 09/2012. Opted to observe.   . Degenerative disc disease, cervical 11/16/2016   Overview:  Had neck pain ->>Plain films 09/2015 (referred to PT).   . Dysmenorrhea 10/11/2013  . Family history of adverse reaction to anesthesia    adopted  . Fibroids, intramural 10/11/2013  . GERD (gastroesophageal reflux disease)    occ- no meds  . Hemangioma 08/13/2017   Overview:  cavernous hemangioma abdominal wall- removed   . History of blood clots 2011   LEFT ARM-AFTER SURGERY  . Intramural leiomyoma of uterus 09/11/2015   Overview:  Pelvic US 07/2015. Followed by Dr. Kenton Kingfisher - OBGYN.   Marland Kitchen Keloid of skin 12/26/2013  . Liver cyst 11/16/2016   Overview:  Has small benign appearing liver cysts on CT A/P + MRI Liver (2017). Had consult w/ Biliary at [facility] and found to also have a  benign hemangioma.   . Pap smear abnormality of cervix with ASCUS favoring benign 08/20/2015  . Postinflammatory hyperpigmentation 12/12/2013  . Seborrheic dermatitis 03/01/2014    Past Surgical History:  Procedure Laterality Date  . BREAST RECONSTRUCTION Bilateral 2011   Dr Greta Doom in [place]    . COMPLETE MASTECTOMY W/ SENTINEL NODE BIOPSY Bilateral 2010   Right breast DCIS  . CYSTOSCOPY WITH URETHRAL DILATATION N/A 09/15/2017   Procedure: CYSTOSCOPY WITH URETHRAL DILATATION;  Surgeon: Abbie Sons, MD;  Location: [facility] ORS;  Service: Urology;  Laterality: N/A;  . EXCISION MASS ABDOMINAL N/A 12/26/2015   EXCISION MASS ABDOMINAL; CAVERNOUS HEMANGIOMA Seeplaputhur Robinette Haines, MD;  Location: [facility] ORS;  Service: General;  Laterality: N/A;  . KNEE ARTHROSCOPY Right     There were no vitals filed for this visit.    Pelvic Floor Physical Therapy Treatment Note and Discharge Summary  SCREENING  Changes in medications, allergies, or medical history?: none     SUBJECTIVE  Patient reports: She is doing well, no return of the lump in her throat sensation. Has gained some neck motion though it is still stiff and mildly painful with L rotation and side-bending. She is not experiencing urgency or frequency or pelvic pain.  Precautions:  cancer  Pain update: Mild discomfort in L side of  neck  Patient Goals: Prevent return of hip/groin pain and decrease urgency/frequency.   OBJECTIVE  Changes in: Posture/Observations:  Patient uses improved posture with sitting, standing, and walking, is not slumping and walking heavy like she did at assessment.  Range of Motion/Flexibilty:  Pt. Demonstrated ~ 60 degrees of rotation to L and pain with R cervical side-bending with ~ 15 % restriction prior to treatment. Gained ~ 20 degrees of motion in L rotation and no pain with rotation or side-bending following treatment.   Palpation: TTP through L>R adductors prior to treatment,  equal and minimal tenderness following treatment.  INTERVENTIONS THIS SESSION: Manual: performed TP release to L adductors and upper trapezius muscles to decrease spasm and pain. Self-care: educated Pt. On her ability to perform self TP release to her adductors if needed for further reduction of spasm and to prevent return  Of symptoms. Reviewed and re-printed HEP for her reference and to make sure she will not have return of Sx. Upon D/C.  Total time: 60 min.                         PT Education - 03/05/18 2117    Education provided  Yes    Education Details  See Pt. Instructions and Interventions this session.    Person(s) Educated  Patient    Methods  Explanation;Verbal cues;Handout    Comprehension  Verbalized understanding       PT Short Term Goals - 03/05/18 1108      PT SHORT TERM GOAL #1   Title  Patient will consistently use squatty-potty at home to decrease resistance for urination and allow more complete emptying of bowel/bladder    Time  5    Period  Weeks    Status  Achieved    Target Date  11/27/17      PT SHORT TERM GOAL #2   Title  Patient will demonstrate improved sitting and standing posture to demonstrate learning and decrease stress on the pelvic floor with functional activity.    Time  5    Period  Weeks    Status  Achieved    Target Date  11/27/17      PT SHORT TERM GOAL #3   Title  Patient will report no feeling of discomfort/pain in the R hip and thigh to demonstrate decreased PFM tension and improved posture.    Time  8    Period  Weeks    Status  Achieved    Target Date  12/18/17        PT Long Term Goals - 03/05/18 1110      PT LONG TERM GOAL #1   Title  Patient will report being able to regulary go 3 hours between trips to urinate to demonstrate decreased urinary frequency and more complete emptying.    Time  12    Period  Weeks    Status  Achieved    Target Date  01/15/18      PT LONG TERM GOAL #2   Title   Patient will demonstrate appropriate body mechanics with bending and lifting heavy objects to allow for decreased stress on the pelvic floor and low back.     Time  12    Period  Weeks    Status  Achieved    Target Date  01/15/18      PT LONG TERM GOAL #3   Title  Patient will score at or below 14/100 on  the PFDI and 27/100 on the UIQ to demonstrate a clinically meaningful decrease in disability and distress due to pelvic floor dysfunction.    Baseline  UDI: 44/100 UIQ:57/100 Final: UDI: 6/100, UIQ: 0/100    Time  12    Period  Weeks    Status  Achieved    Target Date  01/15/18            Plan - 03/05/18 2117    Clinical Impression Statement  Pt. has demonstrated continued relief from "lump in throat" sensation following last visit and she responded well to treatment today, with a ~15 degree improvement in L cervical rotation, decreased discomfort with R cervical side-bending, and decreased adductor spasms.  She feels that she has met all her goals at this time and is agreeable to D/C. She will get a new referral if her MD feels that she has bladder retention that warrants further therapy after her next follow-up. She has met all of our goals and will be discharged at this time.     Clinical Presentation  Stable    Clinical Decision Making  Moderate    Rehab Potential  Good    Clinical Impairments Affecting Rehab Potential  history of cancer, longstanding high stress    PT Frequency  1x / week    PT Duration  12 weeks    PT Treatment/Interventions  ADLs/Self Care Home Management;Biofeedback;Moist Heat;Traction;Functional mobility training;Gait training;Therapeutic activities;Therapeutic exercise;Balance training;Neuromuscular re-education;Manual techniques;Scar mobilization;Passive range of motion;Dry needling;Taping    PT Next Visit Plan  D/C    PT Home Exercise Plan  Diaphragmatic breathing, squatty potty, 3-way stretch and QL stretch, urge-suppression, TA in mod-quad and pelvic  tilts, self posterior fourchette, multi-direction stepping, tall kneeling, child's pose, mini-marches, extension over foam-roller, theracane for upper traps, L rotation mobs for facet syndrome.    Consulted and Agree with Plan of Care  Patient       Patient will benefit from skilled therapeutic intervention in order to improve the following deficits and impairments:  Abnormal gait, Increased fascial restricitons, Improper body mechanics, Pain, Decreased scar mobility, Increased muscle spasms, Postural dysfunction, Impaired flexibility, Other (comment)  Visit Diagnosis: Other muscle spasm  Abnormal posture  Muscular imbalance  Incomplete bladder emptying  Urinary frequency     Problem List Patient Active Problem List   Diagnosis Date Noted  . Urinary frequency 08/14/2017  . Incomplete bladder emptying 08/14/2017  . Urethral bleeding 08/14/2017  . Hemangioma 08/13/2017  . 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 Right hip pain 04/24/2017  . Atypical glandular cells of undetermined significance (AGUS) on cervical Pap smear 02/23/2017  . Degenerative disc disease, cervical 11/16/2016  . Liver cyst 11/16/2016  . Acute pain of left knee 11/11/2016  . 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 History of abnormal cervical Pap smear 09/14/2015  . History of breast cancer 09/11/2015  . Intramural leiomyoma of uterus 09/11/2015  . 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 Pap smear abnormality of cervix with ASCUS favoring benign 08/20/2015  . Vaginal discharge 08/20/2015  . Abscess of arm, right 03/01/2014  . Seborrheic dermatitis 03/01/2014  . Keloid of skin 12/26/2013  . Acne 12/12/2013  . Postinflammatory hyperpigmentation 12/12/2013  . History of myomectomy 10/12/2013  . Body mass index (BMI) of 28.0-28.9 in adult 10/11/2013  . Dysmenorrhea 10/11/2013  . Fibroids, intramural 10/11/2013  . Personal  history of breast cancer 10/11/2013  . Screening  for cervical cancer 10/11/2013  . History of blood clots   . Breast cancer ([place])   . Cyst of tendon sheath 09/28/2012   Willa Rough DPT, ATC Willa Rough 03/05/2018, 9:34 PM  [place] MAIN [facility] SERVICES [address] [place], [place], [address] Phone: [phone]   Fax:  [phone]  Name: KAYLI BEAL MRN: [id number] Date of Birth: 05/03/1966

## 2018-03-12 ENCOUNTER — Ambulatory Visit: Payer: No Typology Code available for payment source

## 2018-03-30 ENCOUNTER — Ambulatory Visit: Payer: BLUE CROSS/BLUE SHIELD | Admitting: General Surgery

## 2018-04-15 DIAGNOSIS — R310 Gross hematuria: Secondary | ICD-10-CM | POA: Insufficient documentation

## 2018-05-04 ENCOUNTER — Ambulatory Visit: Payer: BLUE CROSS/BLUE SHIELD | Admitting: General Surgery

## 2018-06-23 DIAGNOSIS — Z1211 Encounter for screening for malignant neoplasm of colon: Secondary | ICD-10-CM | POA: Insufficient documentation

## 2018-07-08 ENCOUNTER — Encounter: Payer: Self-pay | Admitting: General Surgery

## 2018-07-08 ENCOUNTER — Ambulatory Visit (INDEPENDENT_AMBULATORY_CARE_PROVIDER_SITE_OTHER): Payer: BLUE CROSS/BLUE SHIELD | Admitting: General Surgery

## 2018-07-08 VITALS — BP 124/68 | HR 70 | Resp 12 | Ht 69.0 in | Wt 181.0 lb

## 2018-07-08 DIAGNOSIS — Z86000 Personal history of in-situ neoplasm of breast: Secondary | ICD-10-CM | POA: Diagnosis not present

## 2018-07-08 NOTE — Progress Notes (Signed)
Patient ID: Beth Branch, female   DOB: 06/23/1966, 52 y.o.   MRN: [id number]  Chief Complaint  Patient presents with  . Breast Cancer    HPI Beth Branch is a 52 y.o. female here today for her one year follow up breast cancer. No mammogram . Patient states she has been sore on her left upper breast in the last three week. She has been having shoulder and neck pain.  HPI  Past Medical History:  Diagnosis Date  . Adopted   . BRCA negative 04-05-10  . 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 S/p excision by [facility] 12/18/2015.  Marland Kitchen Complication of anesthesia    REFLUX AFTER ANESTHESIA  . Cyst of tendon sheath 09/28/2012   Overview:  L hand, along the extensor tendons of the long and ring fingers ->[facility] 09/2012. Opted to observe.   . Degenerative disc disease, cervical 11/16/2016   Overview:  Had neck pain ->>Plain films 09/2015 (referred to PT).   . Dysmenorrhea 10/11/2013  . Family history of adverse reaction to anesthesia    adopted  . Fibroids, intramural 10/11/2013  . GERD (gastroesophageal reflux disease)    occ- no meds  . Hemangioma 08/13/2017   Overview:  cavernous hemangioma abdominal wall- removed   . History of blood clots 2011   LEFT ARM-AFTER SURGERY  . Intramural leiomyoma of uterus 09/11/2015   Overview:  Pelvic US 07/2015. Followed by Dr. Kenton Kingfisher - OBGYN.   Marland Kitchen Keloid of skin 12/26/2013  . Liver cyst 11/16/2016   Overview:  Has small benign appearing liver cysts on CT A/P + MRI Liver (2017). Had consult w/ Biliary at [facility] and found to also have a benign hemangioma.   . Pap smear abnormality of cervix with ASCUS favoring benign 08/20/2015  . Postinflammatory hyperpigmentation 12/12/2013  . Seborrheic dermatitis 03/01/2014    Past Surgical History:  Procedure Laterality Date  . BREAST RECONSTRUCTION Bilateral 2011   Dr Greta Doom in [place]    . COMPLETE MASTECTOMY W/ SENTINEL NODE BIOPSY Bilateral 2010   Right breast DCIS  . CYSTOSCOPY WITH URETHRAL DILATATION N/A 09/15/2017   Procedure: CYSTOSCOPY WITH URETHRAL DILATATION;  Surgeon: Abbie Sons, MD;  Location: [facility] ORS;  Service: Urology;  Laterality: N/A;  . EXCISION MASS ABDOMINAL N/A 12/26/2015   EXCISION MASS ABDOMINAL; CAVERNOUS HEMANGIOMA Seeplaputhur Robinette Haines, MD;  Location: [facility] ORS;  Service: General;  Laterality: N/A;  . KNEE ARTHROSCOPY Right     Family History  Adopted: Yes  Problem Relation Age of Onset  . Prostate cancer Neg Hx   . Bladder Cancer Neg Hx   . Kidney cancer Neg Hx     Social History Social History   Tobacco Use  . Smoking status: Never Smoker  . Smokeless tobacco: Never Used  Substance Use Topics  . Alcohol use: No    Alcohol/week: 0.0 standard drinks  . Drug use: No    No Known Allergies  Current Outpatient Medications  Medication Sig Dispense Refill  . b complex vitamins capsule Take 1 capsule by mouth daily.     . cholecalciferol (VITAMIN D) 1000 units tablet Take 1,000 Units by mouth daily.    . Coenzyme Q10 (CO Q 10 PO) Take 1 tablet by mouth daily.    Marland Kitchen ibuprofen (ADVIL,MOTRIN) 800 MG tablet Take 1 tablet (800 mg total)  by mouth every 8 (eight) hours as needed for mild pain or moderate pain. 30 tablet 0  . MAGNESIUM PO Take 1 tablet daily by mouth. ONLY TAKES WHILE ON HER PERIOD    . Methylsulfonylmethane (MSM PO) Take 1 tablet every morning by mouth.    . Multiple Vitamins-Minerals (HAIR SKIN AND NAILS FORMULA PO) Take 1 tablet by mouth daily.     . Omega-3 Fatty Acids (KP FISH OIL) 1200 MG CAPS Take 1 capsule by mouth daily.     . Probiotic Product (PROBIOTIC PO) Take 1 capsule by mouth daily.    . rifaximin (XIFAXAN) 200 MG tablet Take 200 mg by mouth 3 (three) times daily.     No current facility-administered medications for this visit.     Review of Systems Review of Systems  Constitutional:  Negative.   Respiratory: Negative.   Cardiovascular: Negative.     Blood pressure 124/68, pulse 70, resp. rate 12, height '5\' 9"'  (1.753 m), weight 181 lb (82.1 kg).  Physical Exam Physical Exam  Constitutional: She is oriented to person, place, and time. She appears well-developed and well-nourished.  Eyes: Conjunctivae are normal. No scleral icterus.  Neck: Neck supple.  Cardiovascular: Normal rate, regular rhythm and normal heart sounds.  Pulmonary/Chest: Effort normal and breath sounds normal. Right breast exhibits no inverted nipple, no mass, no nipple discharge, no skin change and no tenderness. Left breast exhibits no inverted nipple, no mass, no nipple discharge, no skin change and no tenderness.    Lymphadenopathy:    She has no cervical adenopathy.    She has no axillary adenopathy.       Right: No inguinal and no supraclavicular adenopathy present.       Left: No inguinal and no supraclavicular adenopathy present.  Neurological: She is alert and oriented to person, place, and time.  Skin: Skin is warm and dry.    Data Reviewed No new data.  Assessment    No evidence of recurrent cancer.    Plan  The patient is encouraged to do monthly self-examination and to report any persistent change for assessment.  No indication for mammography based on previous mastectomy.  Return as needed. The patient is aware to call back for any questions or concerns.  The patient is at an age where a screening colonoscopy should be completed if not already done.  She will be contacted to be sure she is aware of this recommendation.  HPI, Physical Exam, Assessment and Plan have been scribed under the direction and in the presence of Hervey Ard, MD.  Gaspar Cola, CMA  .asa  Forest Gleason Chelan Heringer 07/09/2018, 7:43 AM

## 2018-07-08 NOTE — Patient Instructions (Signed)
Return as needed.The patient is aware to call back for any questions or concerns.  

## 2018-07-09 ENCOUNTER — Other Ambulatory Visit: Payer: Self-pay

## 2018-07-09 DIAGNOSIS — Z1211 Encounter for screening for malignant neoplasm of colon: Secondary | ICD-10-CM

## 2018-07-09 DIAGNOSIS — K219 Gastro-esophageal reflux disease without esophagitis: Secondary | ICD-10-CM

## 2018-07-15 ENCOUNTER — Encounter: Payer: Self-pay | Admitting: *Deleted

## 2018-07-29 ENCOUNTER — Ambulatory Visit: Payer: BLUE CROSS/BLUE SHIELD | Admitting: General Surgery

## 2018-11-29 DIAGNOSIS — Z9013 Acquired absence of bilateral breasts and nipples: Secondary | ICD-10-CM | POA: Insufficient documentation

## 2019-09-06 ENCOUNTER — Other Ambulatory Visit: Payer: Self-pay

## 2019-09-06 ENCOUNTER — Ambulatory Visit (INDEPENDENT_AMBULATORY_CARE_PROVIDER_SITE_OTHER): Payer: Self-pay | Admitting: Surgery

## 2019-09-06 ENCOUNTER — Encounter: Payer: Self-pay | Admitting: Surgery

## 2019-09-06 VITALS — BP 136/91 | HR 102 | Temp 97.9°F | Resp 12 | Ht 68.0 in | Wt 179.0 lb

## 2019-09-06 DIAGNOSIS — Z86000 Personal history of in-situ neoplasm of breast: Secondary | ICD-10-CM

## 2019-09-06 NOTE — Patient Instructions (Signed)
Please call our office if you have questions or concerns.   

## 2019-09-06 NOTE — Progress Notes (Signed)
Patient ID: Beth Branch, female   DOB: April 09, 1966, 53 y.o.   MRN: [id number]  Chief Complaint: Left axillary concerns.  History of Present Illness Beth Branch is a 53 y.o. female with a history of bilateral mastectomies with subpectoral implant reconstruction for right breast DCIS.  She reports seeing Dr. Bary Castilla slightly over a year ago for similar issues.  She reports she has been recently working out more to help control her weight.  She has concerned about some discomfort and mild nodularity to the tissues of the left and also to a degree the right axilla.  These have been previously addressed, but she is concerned as to what features/changes that may arise that she should be aware of in terms of potential recurrence or new onset of breast cancer.  Past Medical History Past Medical History:  Diagnosis Date  . Adopted   . BRCA negative 04-05-10  . 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 S/p excision by [facility] 12/18/2015.  Marland Kitchen Complication of anesthesia    REFLUX AFTER ANESTHESIA  . Cyst of tendon sheath 09/28/2012   Overview:  L hand, along the extensor tendons of the long and ring fingers ->[facility] 09/2012. Opted to observe.   . Degenerative disc disease, cervical 11/16/2016   Overview:  Had neck pain ->>Plain films 09/2015 (referred to PT).   . Dysmenorrhea 10/11/2013  . Family history of adverse reaction to anesthesia    adopted  . Fibroids, intramural 10/11/2013  . GERD (gastroesophageal reflux disease)    occ- no meds  . Hemangioma 08/13/2017   Overview:  cavernous hemangioma abdominal wall- removed   . History of blood clots 2011   LEFT ARM-AFTER SURGERY  . Intramural leiomyoma of uterus 09/11/2015   Overview:  Pelvic US 07/2015. Followed by Dr. Kenton Kingfisher - OBGYN.   Marland Kitchen Keloid of skin 12/26/2013  . Liver cyst 11/16/2016   Overview:  Has small benign  appearing liver cysts on CT A/P + MRI Liver (2017). Had consult w/ Biliary at [facility] and found to also have a benign hemangioma.   . Pap smear abnormality of cervix with ASCUS favoring benign 08/20/2015  . Postinflammatory hyperpigmentation 12/12/2013  . Seborrheic dermatitis 03/01/2014      Past Surgical History:  Procedure Laterality Date  . BREAST RECONSTRUCTION Bilateral 2011   Dr Greta Doom in [place]    . COMPLETE MASTECTOMY W/ SENTINEL NODE BIOPSY Bilateral 2010   Right breast DCIS  . CYSTOSCOPY WITH URETHRAL DILATATION N/A 09/15/2017   Procedure: CYSTOSCOPY WITH URETHRAL DILATATION;  Surgeon: Abbie Sons, MD;  Location: [facility] OR[facility];  Service: Urology;  Laterality: N/A;  . EXCISION MASS ABDOMINAL N/A 12/26/2015   EXCISION MASS ABDOMINAL; CAVERNOUS HEMANGIOMA Seeplaputhur Robinette Haines, MD;  Location: [facility] ORS;  Service: General;  Laterality: N/A;  . KNEE ARTHROSCOPY Right     Allergies  Allergen Reactions  . Tamsulosin Other (See Comments) and Rash    Congestion issues.  Congestion issues.  Congestion issues.      Current Outpatient Medications  Medication Sig Dispense Refill  . b complex vitamins capsule Take 1 capsule by mouth daily.     . cholecalciferol (VITAMIN D) 1000 units tablet Take 1,000 Units by mouth daily.    . Coenzyme Q10 (CO Q 10 PO) Take 1 tablet by mouth daily.    Marland Kitchen ibuprofen (ADVIL,MOTRIN) 800  MG tablet Take 1 tablet (800 mg total) by mouth every 8 (eight) hours as needed for mild pain or moderate pain. 30 tablet 0  . MAGNESIUM PO Take 1 tablet daily by mouth. ONLY TAKES WHILE ON HER PERIOD    . Methylsulfonylmethane (MSM PO) Take 1 tablet every morning by mouth.    . Multiple Vitamins-Minerals (HAIR SKIN AND NAILS FORMULA PO) Take 1 tablet by mouth daily.     . Omega-3 Fatty Acids (KP FISH OIL) 1200 MG CAPS Take 1 capsule by mouth daily.     . Probiotic Product (PROBIOTIC PO) Take 1 capsule by mouth daily.     No current  facility-administered medications for this visit.     Family History Family History  Adopted: Yes  Problem Relation Age of Onset  . Prostate cancer Neg Hx   . Bladder Cancer Neg Hx   . Kidney cancer Neg Hx       Social History Social History   Tobacco Use  . Smoking status: Never Smoker  . Smokeless tobacco: Never Used  Substance Use Topics  . Alcohol use: No    Alcohol/week: 0.0 standard drinks  . Drug use: No        Review of Systems  Constitutional: Negative.   HENT: Negative.   Eyes: Negative.   Respiratory: Negative.   Cardiovascular: Negative.   Musculoskeletal: Negative.   Skin: Negative.       Physical Exam Blood pressure (!) 136/91, pulse (!) 102, temperature 97.9 F (36.6 C), temperature source Temporal, resp. rate 12, height '5\' 8"'  (1.727 m), weight 179 lb (81.2 kg), SpO2 97 %.  CONSTITUTIONAL: Well-developed well-nourished female, pleasant, attentive and appropriate. EYES: Sclera non-icteric.   EARS, NOSE, MOUTH AND THROAT: The oropharynx is clear. Oral mucosa is pink and moist. Hearing is intact to voice.  NECK: Trachea is midline, and there is no jugular venous distension.  LYMPH NODES:  Lymph nodes in the neck are not enlarged. RESPIRATORY:  Lungs are clear, and breath sounds are equal bilaterally. Normal respiratory effort without pathologic use of accessory muscles. CARDIOVASCULAR: Heart is regular in rate and rhythm. GI: The abdomen is soft, nontender, and nondistended. GU: Bilateral breast exam consistent with subpectoral implants, the scars and areolar pigmentation as expected.  No appreciable subcutaneous nodularity noted on either breast.  There is no concerning dimpling or dermal changes suggestive of any lymphatic involvement.  There is a fine granularity in the right axilla of the subcu tissues, consistent with some fatty change/necrosis.  Pectoral lateral tendon palpated, and suspected source of soreness.  No appreciable masses in either  axilla. MUSCULOSKELETAL:  Symmetrical muscle tone appreciated in all four extremities.    SKIN: Skin turgor is normal. No pathologic skin lesions appreciated.  NEUROLOGIC:  Motor and sensation appear grossly normal.  Cranial nerves are grossly intact. PSYCH:  Alert and oriented to person, place and time. Affect is appropriate for situation.  Data Reviewed I have personally reviewed the patient's imaging and medical records.    Assessment    Recurring left pectoral muscular soreness following subpectoral breast reconstruction.  History of DCIS right breast.  Plan    I made myself readily available should she follow-up for any new concerns or progressing issues.  Be glad to see her on an annual basis for examination.  We discussed options of imaging but felt none would be prudent nor helpful at this time.  Face-to-face time spent with the patient and care providers was 30 minutes, with  more than 50% of the time spent counseling, educating, and coordinating care of the patient.      Ronny Bacon 09/06/2019, 5:16 PM

## 2019-10-25 IMAGING — MR MR ABDOMEN WO/W CM
11 of 17 series · 30 of 48 positions shown · IV contrast (multihance)
Comparison: 09/21/2016

CLINICAL DATA: Liver hemangioma.

EXAM:
MRI ABDOMEN WITHOUT AND WITH CONTRAST
TECHNIQUE: Multiplanar multisequence MR imaging of the abdomen was performed
both before and after the administration of intravenous contrast.
CONTRAST:  17mL MULTIHANCE GADOBENATE DIMEGLUMINE 529 MG/ML IV SOLN

[Series 4: [id number] · axial · 6.0mm · 1.98mm/px · z∈[-85,+141]mm · 5 of 90 slices shown]
[im 1/90]
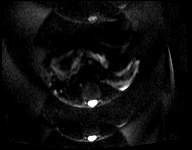
[im 23/90]
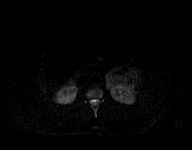
[im 45/90]
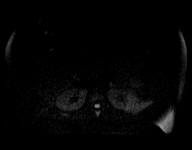
[im 67/90]
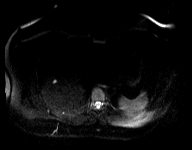
[im 90/90]
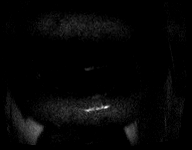

[Series 5: [id number] · axial · 6.0mm · 1.98mm/px · z∈[-85,+141]mm · 2 of 29 slices shown]
[im 1/29]
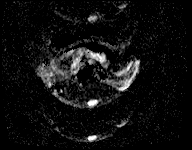
[im 29/29]
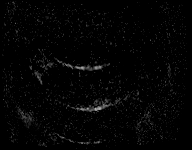

[Series 6: T2 · axial · 6.5mm · 0.74mm/px · z∈[-85,+141]mm · 2 of 30 slices shown (1 of 3)]
[im 1/30]
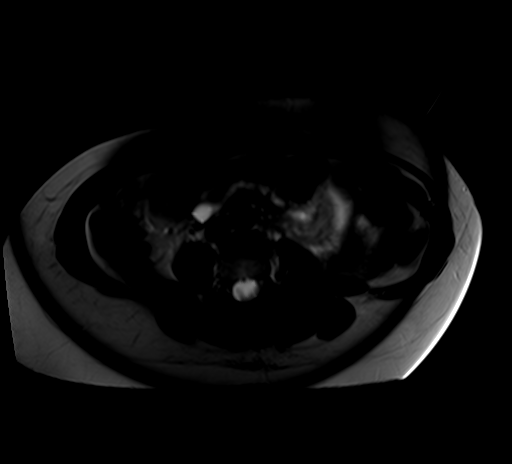
[im 30/30]
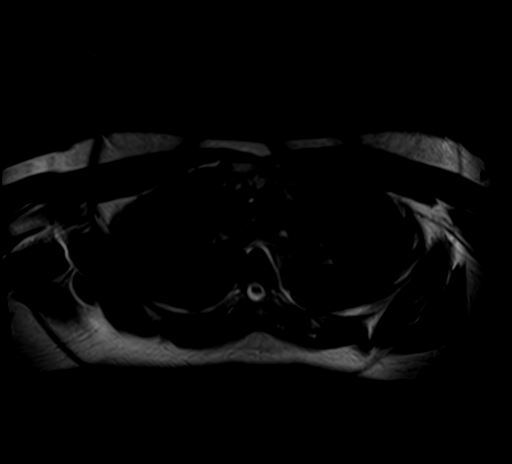

[Series 7: T2 · coronal · 5.0mm · 1.56mm/px · 2 of 30 slices shown (2 of 3)]
[im 1/30]
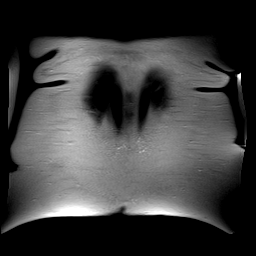
[im 30/30]
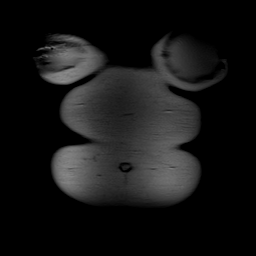

[Series 8: axial tru fisp · axial · 5.0mm · 1.41mm/px · z∈[-125,+82]mm · 2 of 37 slices shown]
[im 1/37]
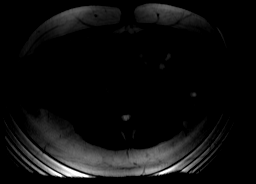
[im 37/37]
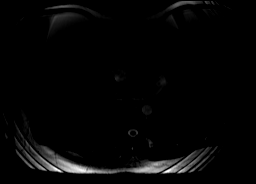

[Series 9: T2 · axial · 5.0mm · 1.37mm/px · z∈[-132,+89]mm · 2 of 35 slices shown (3 of 3)]
[im 1/35]
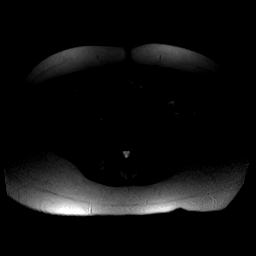
[im 35/35]
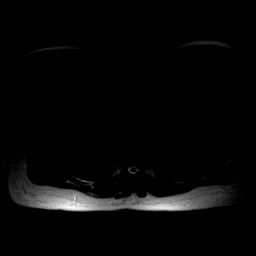

[Series 10: axial in out · axial · 6.0mm · 0.74mm/px · z∈[-122,+78]mm · 3 of 60 slices shown]
[im 1/60]
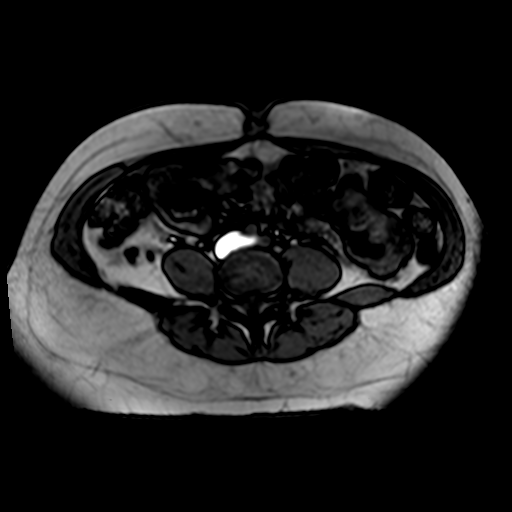
[im 30/60]
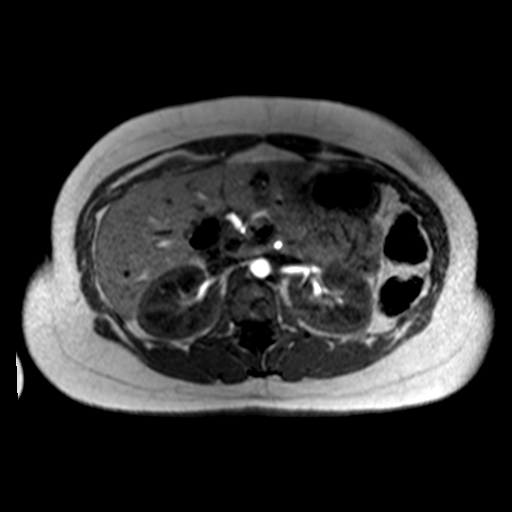
[im 60/60]
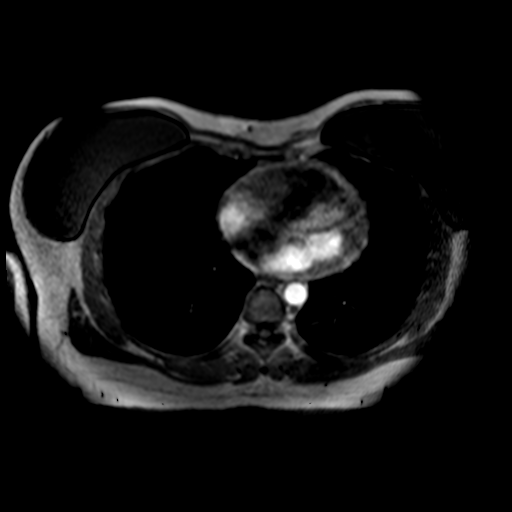

[Series 11: T1 dynamic · axial · non-contrast · 2.5mm · 0.78mm/px · z∈[-113,+85]mm · 3 of 80 slices shown]
[im 1/80]
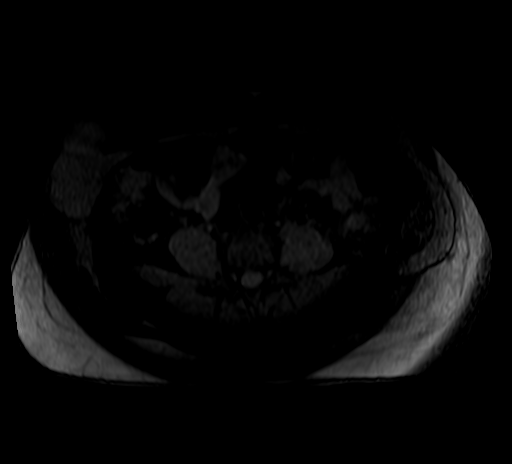
[im 40/80]
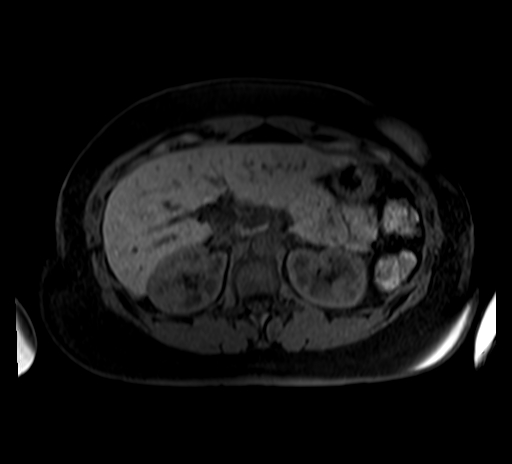
[im 80/80]
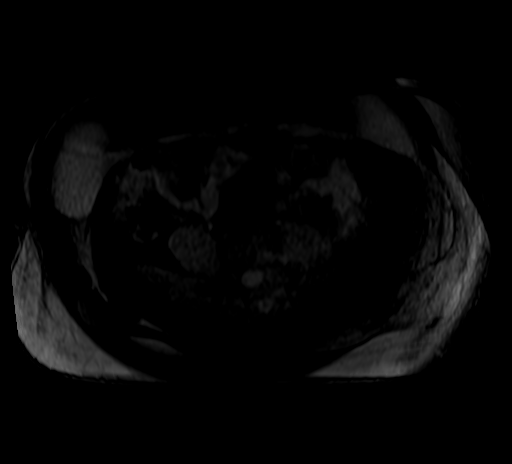

[Series 12: post 25 sec · axial · 2.5mm · 0.78mm/px · z∈[-113,+85]mm · 3 of 80 slices shown]
[im 1/80]
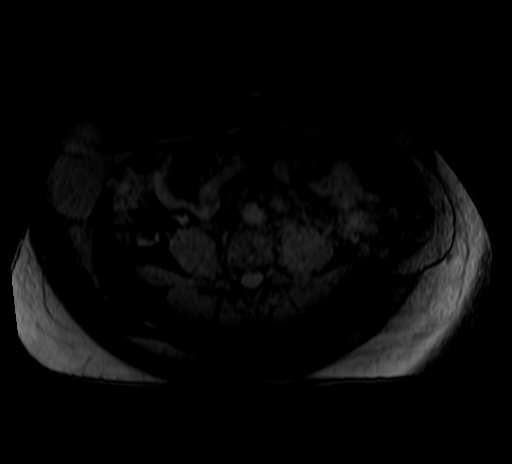
[im 40/80]
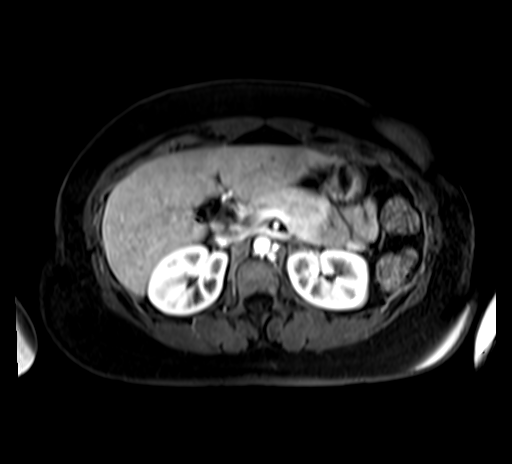
[im 80/80]
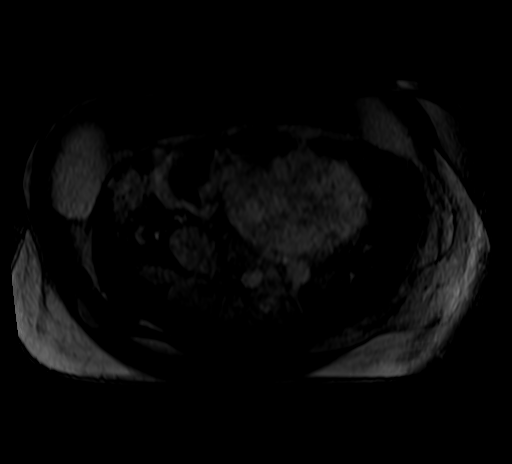

[Series 13: post 25 sec_sub · axial · 2.5mm · 0.78mm/px · z∈[-113,+85]mm · 3 of 80 slices shown]
[im 1/80]
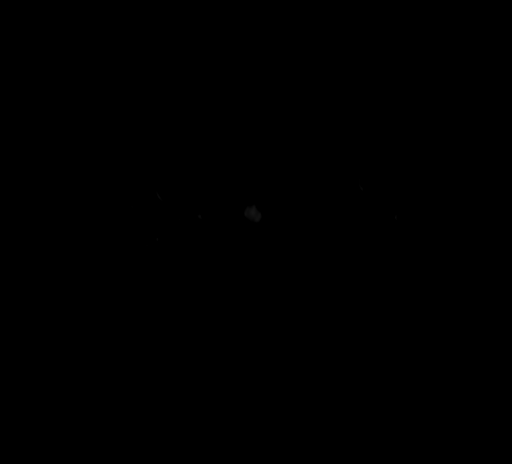
[im 40/80]
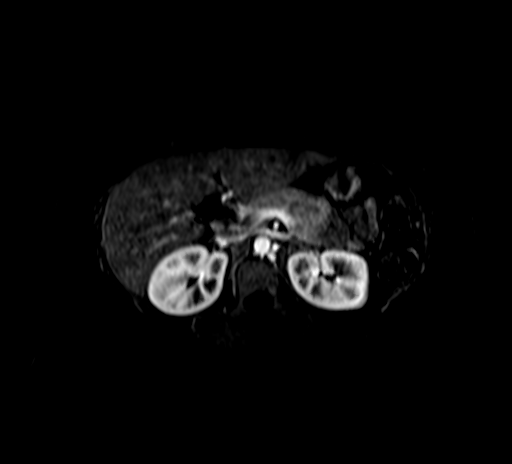
[im 80/80]
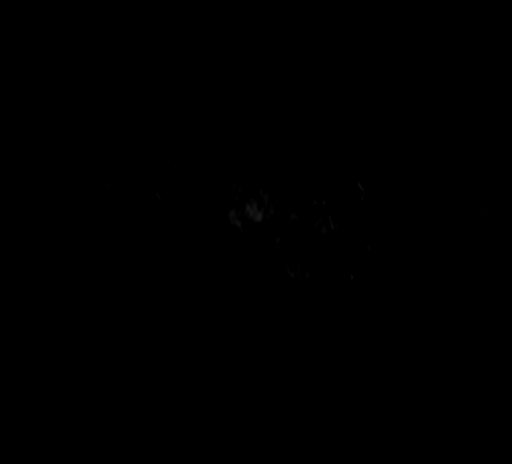

[Series 14: post 45 sec · axial · 2.5mm · 0.78mm/px · z∈[-113,+85]mm · 3 of 80 slices shown]
[im 1/80]
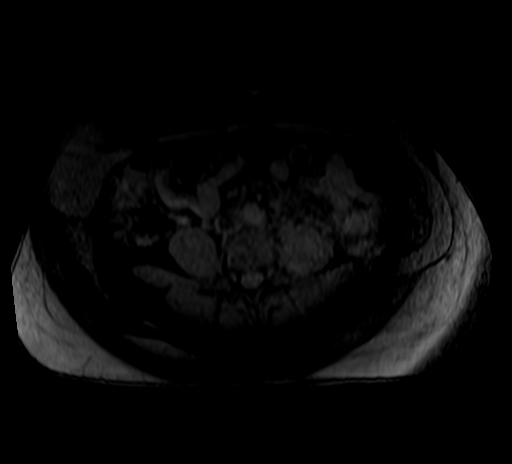
[im 40/80]
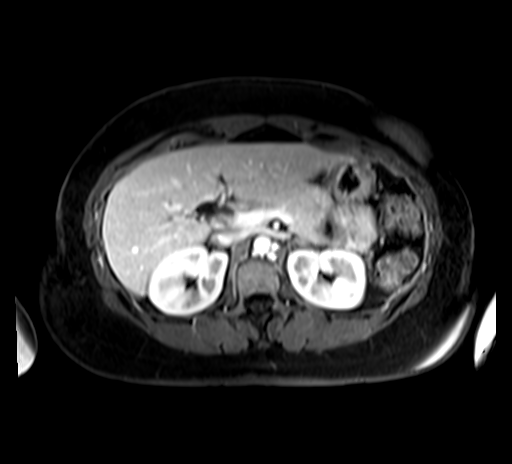
[im 80/80]
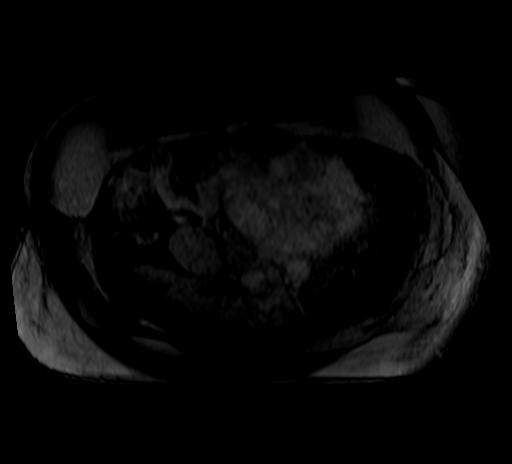

[30 of 48 positions shown; findings below may reference images not displayed]

FINDINGS: Lower chest: Unremarkable.

Hepatobiliary: Similar appearance of multiple hepatic cysts
measuring up to a maximum of 1.7 cm diameter. 1.1 cm cavernous
hemangioma identified in segment V previously is stable and well
seen on precontrast T1 weighted imaging (image 51/11). 6 mm
hypervascular lesion in the dome of the liver (image [DATE]) is
stable and compatible with benign etiology such as flash filling
hemangioma. Gallbladder surgically absent. Stable mild prominence of
extrahepatic bile duct after cholecystectomy. Common bile duct in
the head of the pancreas is normal diameter at 6 mm.

Pancreas: No focal mass lesion. No dilatation of the main duct. No
intraparenchymal cyst. No peripancreatic edema.

Spleen: No splenomegaly. Septated cystic lesion in the anterior
spleen is stable at 2.2 cm..

Adrenals/Urinary Tract: No adrenal nodule or mass. Kidneys
unremarkable.

Stomach/Bowel: Stomach is nondistended. No gastric wall thickening.
No evidence of outlet obstruction. Duodenum is normally positioned
as is the ligament of Treitz. No small bowel or colonic dilatation
within the visualized abdomen.

Vascular/Lymphatic: No abdominal aortic aneurysm. No abdominal
lymphadenopathy

Other:  No intraperitoneal free fluid.

Musculoskeletal: No abnormal marrow enhancement within the
visualized bony anatomy
IMPRESSION: 1. Stable exam. No change in the 1.1 cm segment V cavernous
hemangioma identified on prior study. 6 mm hypervascular focus in
the hepatic dome is also stable and likely reflects benign etiology
such as flash filling hemangioma or vascular malformation. No new or
suspicious abnormality in the liver on today's study.

## 2020-10-23 ENCOUNTER — Other Ambulatory Visit: Payer: Self-pay

## 2020-10-23 ENCOUNTER — Emergency Department: Payer: 59

## 2020-10-23 ENCOUNTER — Encounter: Payer: Self-pay | Admitting: Emergency Medicine

## 2020-10-23 ENCOUNTER — Emergency Department
Admission: EM | Admit: 2020-10-23 | Discharge: 2020-10-23 | Disposition: A | Discharge status: Home / Self Care | Payer: 59 | Attending: Emergency Medicine | Admitting: Emergency Medicine

## 2020-10-23 DIAGNOSIS — S92351A Displaced fracture of fifth metatarsal bone, right foot, initial encounter for closed fracture: Secondary | ICD-10-CM | POA: Diagnosis not present

## 2020-10-23 DIAGNOSIS — S93401A Sprain of unspecified ligament of right ankle, initial encounter: Secondary | ICD-10-CM | POA: Diagnosis not present

## 2020-10-23 DIAGNOSIS — S99921A Unspecified injury of right foot, initial encounter: Secondary | ICD-10-CM | POA: Diagnosis present

## 2020-10-23 DIAGNOSIS — Z853 Personal history of malignant neoplasm of breast: Secondary | ICD-10-CM | POA: Insufficient documentation

## 2020-10-23 DIAGNOSIS — W07XXXA Fall from chair, initial encounter: Secondary | ICD-10-CM | POA: Insufficient documentation

## 2020-10-23 MED ORDER — OXYCODONE-ACETAMINOPHEN 5-325 MG PO TABS: 1.0000 | ORAL_TABLET | ORAL | 0 refills | Status: AC | PRN

## 2020-10-23 MED ORDER — ONDANSETRON 4 MG PO TBDP
4.0000 mg | ORAL_TABLET | Freq: Once | ORAL | Status: AC
  Administered 2020-10-23: 4 mg via ORAL
  Filled 2020-10-23: qty 1

## 2020-10-23 MED ORDER — MORPHINE SULFATE (PF) 4 MG/ML IV SOLN
4.0000 mg | Freq: Once | INTRAVENOUS | Status: AC
  Administered 2020-10-23: 4 mg via INTRAMUSCULAR
  Filled 2020-10-23: qty 1

## 2020-10-23 NOTE — Discharge Instructions (Addendum)
Please wear your postoperative shoe throughout the day.  Avoid placing as much weight on the foot as possible.  Use your crutches.  Please take pain medication as needed as prescribed.  Do not drink alcohol or drive while taking pain medication.  Call orthopedics at the number provided to arrange a follow-up appointment soon as possible.

## 2020-10-23 NOTE — ED Provider Notes (Signed)
[facility] Emergency Department Provider Note  Time seen: 2:26 AM  I have reviewed the triage vital signs and the nursing notes.   HISTORY  Chief Complaint Right foot pain, fall  HPI Beth Branch is a 55 y.o. female with a past medical history of gastric reflux, presents to the emergency department for right foot pain and a fall.  According to the patient she fell approximate 3 to 4 feet off the ground onto her right foot with immediate pain to the right foot followed by swelling.  Patient states pain with any attempted ambulation.  Denies hitting her head denies any other injuries.  Negative review of systems.   Past Medical History:  Diagnosis Date  . Adopted   . BRCA negative 04-05-10  . 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 S/p excision by [facility] 12/18/2015.  Marland Kitchen Complication of anesthesia    REFLUX AFTER ANESTHESIA  . Cyst of tendon sheath 09/28/2012   Overview:  L hand, along the extensor tendons of the long and ring fingers ->[facility] 09/2012. Opted to observe.   . Degenerative disc disease, cervical 11/16/2016   Overview:  Had neck pain ->>Plain films 09/2015 (referred to PT).   . Dysmenorrhea 10/11/2013  . Family history of adverse reaction to anesthesia    adopted  . Fibroids, intramural 10/11/2013  . GERD (gastroesophageal reflux disease)    occ- no meds  . Hemangioma 08/13/2017   Overview:  cavernous hemangioma abdominal wall- removed   . History of blood clots 2011   LEFT ARM-AFTER SURGERY  . Intramural leiomyoma of uterus 09/11/2015   Overview:  Pelvic US 07/2015. Followed by Dr. Kenton Kingfisher - OBGYN.   Marland Kitchen Keloid of skin 12/26/2013  . Liver cyst 11/16/2016   Overview:  Has small benign appearing liver cysts on CT A/P + MRI Liver (2017). Had consult w/ Biliary at [facility] and found to also have a benign hemangioma.   . Pap  smear abnormality of cervix with ASCUS favoring benign 08/20/2015  . Postinflammatory hyperpigmentation 12/12/2013  . Seborrheic dermatitis 03/01/2014    Patient Active Problem List   Diagnosis Date Noted  . S/P bilateral mastectomy 11/29/2018  . Screen for colon cancer 06/23/2018  . Gross hematuria 04/15/2018  . GERD (gastroesophageal reflux disease) 02/03/2018  . Personal history of urethral stricture 10/14/2017  . Vitamin D deficiency 10/08/2017  . Urinary frequency 08/14/2017  . Incomplete bladder emptying 08/14/2017  . Urethral bleeding 08/14/2017  . Hemangioma 08/13/2017  . 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 Right hip pain 04/24/2017  . Atypical glandular cells of undetermined significance (AGUS) on cervical Pap smear 02/23/2017  . Degenerative disc disease, cervical 11/16/2016  . Liver cyst 11/16/2016  . Acute pain of left knee 11/11/2016  . 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 History of abnormal cervical Pap smear 09/14/2015  . History of breast cancer 09/11/2015  . Intramural leiomyoma of uterus 09/11/2015  . 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 Pap smear abnormality of cervix with ASCUS favoring benign 08/20/2015  . Vaginal discharge 08/20/2015  . Abscess of arm, right 03/01/2014  . Seborrheic dermatitis 03/01/2014  . Keloid of skin 12/26/2013  . Acne 12/12/2013  . Postinflammatory hyperpigmentation 12/12/2013  . History of myomectomy 10/12/2013  . Body mass index (  BMI) of 28.0-28.9 in adult 10/11/2013  . Dysmenorrhea 10/11/2013  . Fibroids, intramural 10/11/2013  . Personal history of breast cancer 10/11/2013  . Screening for cervical cancer 10/11/2013  . History of blood clots   . Breast cancer ([place])   . Cyst of tendon sheath 09/28/2012    Past Surgical History:  Procedure Laterality Date  . BREAST RECONSTRUCTION Bilateral 2011   Dr Greta Doom in [place]    .  COMPLETE MASTECTOMY W/ SENTINEL NODE BIOPSY Bilateral 2010   Right breast DCIS  . CYSTOSCOPY WITH URETHRAL DILATATION N/A 09/15/2017   Procedure: CYSTOSCOPY WITH URETHRAL DILATATION;  Surgeon: Abbie Sons, MD;  Location: [facility] ORS;  Service: Urology;  Laterality: N/A;  . EXCISION MASS ABDOMINAL N/A 12/26/2015   EXCISION MASS ABDOMINAL; CAVERNOUS HEMANGIOMA Seeplaputhur Robinette Haines, MD;  Location: [facility] ORS;  Service: General;  Laterality: N/A;  . KNEE ARTHROSCOPY Right     Prior to Admission medications   Medication Sig Start Date End Date Taking? Authorizing Provider  b complex vitamins capsule Take 1 capsule by mouth daily.  07/19/10   [provider]  cholecalciferol (VITAMIN D) 1000 units tablet Take 1,000 Units by mouth daily.    [provider]  Coenzyme Q10 (CO Q 10 PO) Take 1 tablet by mouth daily.    [provider]  ibuprofen (ADVIL,MOTRIN) 800 MG tablet Take 1 tablet (800 mg total) by mouth every 8 (eight) hours as needed for mild pain or moderate pain. 08/27/15   Betancourt, Aura Fey, NP  MAGNESIUM PO Take 1 tablet daily by mouth. ONLY TAKES WHILE ON HER PERIOD    [provider]  Methylsulfonylmethane (MSM PO) Take 1 tablet every morning by mouth.    [provider]  Multiple Vitamins-Minerals (HAIR SKIN AND NAILS FORMULA PO) Take 1 tablet by mouth daily.     [provider]  Omega-3 Fatty Acids (KP FISH OIL) 1200 MG CAPS Take 1 capsule by mouth daily.     [provider]  Probiotic Product (PROBIOTIC PO) Take 1 capsule by mouth daily.    [provider]    Allergies  Allergen Reactions  . Tamsulosin Other (See Comments) and Rash    Congestion issues.  Congestion issues.  Congestion issues.      Family History  Adopted: Yes  Problem Relation Age of Onset  . Prostate cancer Neg Hx   . Bladder Cancer Neg Hx   . Kidney cancer Neg Hx     Social History Social History   Tobacco Use  . Smoking status:  Never Smoker  . Smokeless tobacco: Never Used  Vaping Use  . Vaping Use: Never used  Substance Use Topics  . Alcohol use: No    Alcohol/week: 0.0 standard drinks  . Drug use: No    Review of Systems Constitutional: Negative for loss of consciousness Cardiovascular: Negative for chest pain. Respiratory: Negative for shortness of breath. Gastrointestinal: Negative for abdominal pain Musculoskeletal: Right foot pain and swelling Skin: Negative for laceration All other ROS negative  ____________________________________________   PHYSICAL EXAM:  VITAL SIGNS: ED Triage Vitals  Enc Vitals Group     BP 10/23/20 0124 113/67     Pulse Rate 10/23/20 0124 87     Resp 10/23/20 0124 18     Temp 10/23/20 0124 98 F (36.7 C)     Temp Source 10/23/20 0124 Oral     SpO2 10/23/20 0124 97 %     Weight 10/23/20  0107 179 lb (81.2 kg)     Height 10/23/20 0107 '5\' 7"'  (1.702 m)     Head Circumference --      Peak Flow --      Pain Score 10/23/20 0107 8     Pain Loc --      Pain Edu? --      Excl. in [place]? --    Constitutional: Alert and oriented. Well appearing and in no distress. Eyes: Normal exam ENT      Head: Normocephalic and atraumatic.      Mouth/Throat: Mucous membranes are moist. Cardiovascular: Normal rate, regular rhythm. Respiratory: Normal respiratory effort without tachypnea nor retractions. Breath sounds are clear Gastrointestinal: Soft and nontender. No distention.   Musculoskeletal: Moderate swelling to the lateral aspect of the right foot and ankle.  Moderate tenderness palpation of this area.  Neurovascular intact distally. Neurologic:  Normal speech and language. No gross focal neurologic deficits  Skin:  Skin is warm, dry and intact.  Psychiatric: Mood and affect are normal.   ____________________________________________    RADIOLOGY  Right foot x-ray shows acute mildly displaced oblique fracture through the mid right fifth metatarsal. Ankle x-ray shows fifth  metatarsal fracture as described above along with a small avulsion fracture of the lateral malleolus.  ____________________________________________   INITIAL IMPRESSION / ASSESSMENT AND PLAN / ED COURSE  Pertinent labs & imaging results that were available during my care of the patient were reviewed by me and considered in my medical decision making (see chart for details).   Patient presents emergency department after a fall with right foot and ankle pain.  X-rays consistent with a right fifth metatarsal fracture as well as an avulsion fracture of the lateral malleolus.  We will place the patient in a postop shoe with crutches and pain medication.  We will have the patient follow-up with orthopedics this week.  Patient agreeable plan of care.  Beth Branch was evaluated in Emergency Department on 10/23/2020 for the symptoms described in the history of present illness. She was evaluated in the context of the global COVID-19 pandemic, which necessitated consideration that the patient might be at risk for infection with the SARS-CoV-2 virus that causes COVID-19. Institutional protocols and algorithms that pertain to the evaluation of patients at risk for COVID-19 are in a state of rapid change based on information released by regulatory bodies including the CDC and federal and state organizations. These policies and algorithms were followed during the patient's care in the ED.  ____________________________________________   FINAL CLINICAL IMPRESSION(S) / ED DIAGNOSES  Right fifth metatarsal fracture Sprain ankle   Harvest Dark, MD 10/23/20 [phone]

## 2020-10-23 NOTE — ED Triage Notes (Signed)
Pt to triage via w/c with no distress noted; pt reports that she "flipped out of a chair and fell on her foot"; swelling and pain to rt ankle noted; denies any other c/o or injuries

## 2020-10-25 ENCOUNTER — Other Ambulatory Visit: Payer: Self-pay | Admitting: Podiatry

## 2020-10-31 ENCOUNTER — Encounter: Payer: Self-pay | Admitting: Podiatry

## 2020-10-31 ENCOUNTER — Other Ambulatory Visit: Payer: Self-pay

## 2020-11-01 ENCOUNTER — Encounter: Payer: Self-pay | Admitting: Anesthesiology

## 2020-11-06 ENCOUNTER — Other Ambulatory Visit: Payer: PRIVATE HEALTH INSURANCE | Attending: Podiatry

## 2020-11-06 NOTE — Discharge Instructions (Signed)
[place] DR. TROXLER, DR. Vickki Muff, AND DR. Wayne   1. Take your medication as prescribed.  Pain medication should be taken only as needed.  2. Keep the dressing clean, dry and intact.  3. Keep your foot elevated above the heart level for the first 48 hours.  4. Walking to the bathroom and brief periods of walking are acceptable, unless we have instructed you to be non-weight bearing.  5. Always wear your post-op shoe when walking.  Always use your crutches if you are to be non-weight bearing.  6. Do not take a shower. Baths are permissible as long as the foot is kept out of the water.   7. Every hour you are awake:  - Bend your knee 15 times. - Flex foot 15 times - Massage calf 15 times  8. Call [facility] [phone]) if any of the following problems occur: - You develop a temperature or fever. - The bandage becomes saturated with blood. - Medication does not stop your pain. - Injury of the foot occurs. - Any symptoms of infection including redness, odor, or red streaks running from wound.   General Anesthesia, Adult, Care After This sheet gives you information about how to care for yourself after your procedure. Your health care provider may also give you more specific instructions. If you have problems or questions, contact your health care provider. What can I expect after the procedure? After the procedure, the following side effects are common:  Pain or discomfort at the IV site.  Nausea.  Vomiting.  Sore throat.  Trouble concentrating.  Feeling cold or chills.  Feeling weak or tired.  Sleepiness and fatigue.  Soreness and body aches. These side effects can affect parts of the body that were not involved in surgery. Follow these instructions at home: For the time period you were told by your health care provider:  Rest.  Do not  participate in activities where you could fall or become injured.  Do not drive or use machinery.  Do not drink alcohol.  Do not take sleeping pills or medicines that cause drowsiness.  Do not make important decisions or sign legal documents.  Do not take care of children on your own.   Eating and drinking  Follow any instructions from your health care provider about eating or drinking restrictions.  When you feel hungry, start by eating small amounts of foods that are soft and easy to digest (bland), such as toast. Gradually return to your regular diet.  Drink enough fluid to keep your urine pale yellow.  If you vomit, rehydrate by drinking water, juice, or clear broth. General instructions  If you have sleep apnea, surgery and certain medicines can increase your risk for breathing problems. Follow instructions from your health care provider about wearing your sleep device: ? Anytime you are sleeping, including during daytime naps. ? While taking prescription pain medicines, sleeping medicines, or medicines that make you drowsy.  Have a responsible adult stay with you for the time you are told. It is important to have someone help care for you until you are awake and alert.  Return to your normal activities as told by your health care provider. Ask your health care provider what activities are safe for you.  Take over-the-counter and prescription medicines only as told by your health care provider.  If you smoke, do not smoke without supervision.  Keep all follow-up visits  as told by your health care provider. This is important. Contact a health care provider if:  You have nausea or vomiting that does not get better with medicine.  You cannot eat or drink without vomiting.  You have pain that does not get better with medicine.  You are unable to pass urine.  You develop a skin rash.  You have a fever.  You have redness around your IV site that gets worse. Get help  right away if:  You have difficulty breathing.  You have chest pain.  You have blood in your urine or stool, or you vomit blood. Summary  After the procedure, it is common to have a sore throat or nausea. It is also common to feel tired.  Have a responsible adult stay with you for the time you are told. It is important to have someone help care for you until you are awake and alert.  When you feel hungry, start by eating small amounts of foods that are soft and easy to digest (bland), such as toast. Gradually return to your regular diet.  Drink enough fluid to keep your urine pale yellow.  Return to your normal activities as told by your health care provider. Ask your health care provider what activities are safe for you. This information is not intended to replace advice given to you by your health care provider. Make sure you discuss any questions you have with your health care provider. Document Revised: 06/21/2020 Document Reviewed: 01/19/2020 Elsevier Patient Education  2021 [employer]  

## 2020-11-08 ENCOUNTER — Ambulatory Visit: Admission: RE | Admit: 2020-11-08 | Payer: PRIVATE HEALTH INSURANCE | Source: Home / Self Care | Admitting: Podiatry

## 2020-11-08 SURGERY — OPEN REDUCTION INTERNAL FIXATION (ORIF) METATARSAL (TOE) FRACTURE
Anesthesia: Choice | Site: Toe | Laterality: Right

## 2024-05-03 NOTE — Therapy (Signed)
 OUTPATIENT OCCUPATIONAL THERAPY ORTHO EVALUATION  Patient Name: Beth Branch MRN: [id number] DOB:[birth year], 58 y.o., female Today's Date: 05/04/2024  PCP: Toppin, Jean M, MD  REFERRING PROVIDER: Toppin, Jean M, MD   END OF SESSION:  OT End of Session - 05/04/24 1305     Visit Number 1    Number of Visits 5    Date for OT Re-Evaluation 06/03/24    Authorization Type [employer]    OT Start Time 1305    OT Stop Time 1404    OT Time Calculation (min) 59 min    Activity Tolerance Patient tolerated treatment well;No increased pain;Patient limited by fatigue;Patient limited by pain    Behavior During Therapy [facility] for tasks assessed/performed          Past Medical History:  Diagnosis Date   Adopted    BRCA negative 04-05-10   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 S/p excision by [facility] 12/18/2015.   Complication of anesthesia    REFLUX AFTER ANESTHESIA   Cyst of tendon sheath 09/28/2012   Overview:  L hand, along the extensor tendons of the long and ring fingers ->[facility] 09/2012. Opted to observe.    Degenerative disc disease, cervical 11/16/2016   Overview:  Had neck pain ->>Plain films 09/2015 (referred to PT).    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 Followed by Dr. Arloa - OBGYN.    Keloid of skin 12/26/2013   Liver cyst 11/16/2016   Overview:  Has small benign appearing liver cysts on CT A/P + MRI Liver (2017). Had consult w/ Biliary at [facility] and found to also have a benign hemangioma.    Pap smear  abnormality of cervix with ASCUS favoring benign 08/20/2015   Postinflammatory hyperpigmentation 12/12/2013   Seborrheic dermatitis 03/01/2014   Past Surgical History:  Procedure Laterality Date   BREAST RECONSTRUCTION Bilateral 2011   Dr Alyse in [place]   CHOLECYSTECTOMY     COMPLETE MASTECTOMY W/ SENTINEL NODE BIOPSY Bilateral 2010   Right breast DCIS   CYSTOSCOPY WITH URETHRAL DILATATION N/A 09/15/2017   Procedure: CYSTOSCOPY WITH URETHRAL DILATATION;  Surgeon: Twylla Glendia BROCKS, MD;  Location: [facility] ORS;  Service: Urology;  Laterality: N/A;   EXCISION MASS ABDOMINAL N/A 12/26/2015   EXCISION MASS ABDOMINAL; CAVERNOUS HEMANGIOMA Seeplaputhur KANDICE Muse, MD;  Location: [facility] ORS;  Service: General;  Laterality: N/A;   KNEE ARTHROSCOPY Right    Patient Active Problem List   Diagnosis Date Noted   S/P bilateral mastectomy 11/29/2018   Screen for colon cancer 06/23/2018   Gross hematuria 04/15/2018   GERD (gastroesophageal reflux disease) 02/03/2018   Personal history of urethral stricture 10/14/2017   Vitamin D deficiency 10/08/2017   Urinary frequency 08/14/2017   Incomplete bladder emptying 08/14/2017   Urethral bleeding 08/14/2017   Hemangioma 08/13/2017   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   Right hip pain 04/24/2017   Atypical glandular cells of undetermined significance (AGUS) on cervical Pap smear 02/23/2017  Degenerative disc disease, cervical 11/16/2016   Liver cyst 11/16/2016   Acute pain of left knee 11/11/2016   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   Breast neoplasm, 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   History of abnormal cervical Pap smear 09/14/2015   History of breast cancer 09/11/2015   Intramural leiomyoma of uterus 09/11/2015   Ductal carcinoma in situ (DCIS) of right breast 09/04/2015   Pap smear abnormality of cervix with ASCUS favoring benign 08/20/2015   Vaginal discharge 08/20/2015   Abscess of arm, right 03/01/2014   Seborrheic dermatitis 03/01/2014   Keloid  of skin 12/26/2013   Acne 12/12/2013   Postinflammatory hyperpigmentation 12/12/2013   History of myomectomy 10/12/2013   Body mass index (BMI) of 28.0-28.9 in adult 10/11/2013   Dysmenorrhea 10/11/2013   Fibroids, intramural 10/11/2013   Personal history of breast cancer 10/11/2013   Screening for cervical cancer 10/11/2013   History of blood clots    Breast cancer (HCC)    Cyst of tendon sheath 09/28/2012    ONSET DATE: DOS approx 03/30/24  REFERRING DIAG: G56.03 (ICD-10-CM) - Carpal tunnel syndrome, bilateral upper limbs   THERAPY DIAG:  Paresthesia of skin  Localized edema  Muscle weakness (generalized)  Pain in right hand  Stiffness of right hand, not elsewhere classified  Rationale for Evaluation and Treatment: Rehabilitation  SUBJECTIVE:   SUBJECTIVE STATEMENT: 5 weeks s/p Rt MF TFR and CTR. She states that she's had Rt hand numbness for 3-4 years, thought it was from neck OA.  She was in PT which helped, but numbness came back. She eventually had Rt CTR and MF TFR, and did not get formal therapy.   She states lingering pain and stiffness in MF and wrist/hand now approx 5 weeks post-op.   Paresthesia is gone, and she hasn't triggered.   She does have a Lt hand MF trigger finger as well, that has not been treated yet.  Has had hx of ganglion cysts.  She has had deQuervain's in the past.  She works from home and types a lot!  She does wake up in pain in the night.     PERTINENT HISTORY: History of ganglion cysts, de Quervain's tenosynovitis, also some triggering in the left middle finger.  Lots of overuse syndromes, also recently higher A1c  PRECAUTIONS: None  RED FLAGS: None   WEIGHT BEARING RESTRICTIONS: No  PAIN:  Are you having pain? Yes: NPRS scale: 2/10 at rest and at worst in the past week up to 8-9/10 with daily tasks, work Pain location: Right hand surgical release areas Pain description: Aching and sometimes sharp Aggravating factors: Repetitive heavy  gripping etc. Relieving factors: Heat  FALLS: Has patient fallen in last 6 months? No  LIVING ENVIRONMENT: Lives with: lives with their spouse Lives in: House/apartment Has following equipment at home: None  PLOF: Independent  PATIENT GOALS: To improve pain and use of right dominant hand  NEXT MD VISIT: As needed   OBJECTIVE: (All objective assessments below are from initial evaluation on: 05/04/24 unless otherwise specified.)   HAND DOMINANCE: Right   ADLs: Overall ADLs: States decreased ability to grab, hold household objects, pain and difficulty to open containers, perform FMS tasks (manipulate fasteners on clothing)   FUNCTIONAL OUTCOME MEASURES: Eval: Patient Specific Functional Scale: 4.6 (typing, day-to-day activities, exercising with hand)  (Higher Score  =  Better Ability for the Selected Tasks)       UPPER EXTREMITY ROM     Shoulder to Wrist AROM Right eval Left eval  Forearm supination 85   Forearm pronation  90   Wrist flexion 73 78  Wrist extension 61 75  (Blank rows = not tested)   Hand AROM Right eval  Full Fist Ability (or Gap to Distal Palmar Crease) Full but painful fist  Thumb Opposition  (Kapandji Scale)  9/10  Long MCP (0-90)  0- 77  Long PIP (0-100) (-7) - 95  Long DIP (0-70) 0-  67  (Blank rows = not tested)   HAND FUNCTION: Eval: Observed weakness in affected Rt hand.  Grip strength Right: 27 lbs tender, Left: 37 lbs   COORDINATION: Eval: No significant coordination impairments other than inhibiting pain at the hand and wrist and some stiffness  SENSATION: Eval:  Light touch intact today,   though a bit hypersensitive around surgical areas and they are still a bit swollen and puffy looking today  EDEMA:   Eval:  Mildly swollen in rt hand and wrist today  COGNITION: Eval: Overall cognitive status: WFL for evaluation today   OBSERVATIONS:   Eval: Surgical site is well-healing though slightly hypersensitive and swollen still.   She has some stiffness to the middle finger in flexion and extension as well as in the wrist.  Right carpal tunnel and middle finger trigger finger release   TODAY'S TREATMENT:  Post-evaluation treatment:   For safety/self-care she was given information on management of postop hypersensitivity and progressive desensitization of her nerves.  She was given multiple means and ways to help with this problem.  She found vibration to be especially effective.  She was also told to do no painful weightbearing or repetitive motion if possible over the next 1 to 2 weeks while performing home exercise program.  Additionally, she was given the following home exercise program that includes stretching exercises, tendon gliding activities and nerve gliding neuromuscular reeducation activities.  We do each 1 together to ensure proper performance and no significant pain.  She states understanding all directions at the end of the session and leaves in no significant pain   Exercises - Wrist Prayer Stretch  - 4 x daily - 3-5 reps - 15 sec hold - BACK KNUCKLE STRETCHES   - 4 x daily - 3-5 reps - 15 sec hold - Seated Finger Composite Flexion Stretch  - 4 x daily - 3-5 reps - 15 hold - PUSH KNUCKLES DOWN  - 4 x daily - 3-5 reps - 15 seconds hold - Tendon Glides  - 4-6 x daily - 3-5 reps - 2-3 seconds hold - Thumb Opposition  - 4-6 x daily - 10 reps - Median Nerve Flossing  - 2-3 x daily - 5-10 reps  PATIENT EDUCATION: Education details: See tx section above for details  Person educated: Patient Education method: Engineer, structural, Teach back, Handouts  Education comprehension: States and demonstrates understanding, Additional Education required    HOME EXERCISE PROGRAM: Access Code: [email or web address] URL: [email or web address] Date: 05/04/2024 Prepared by: Melvenia Ada   GOALS: Goals reviewed with patient? Yes   SHORT TERM GOALS: (STG required if POC>30 days) Target Date: 05/13/2024  Pt  will obtain protective, custom orthotic. Goal status: TBD/PRN  2.  Pt will demo/state understanding of initial HEP to improve pain levels and prerequisite motion. Goal status: INITIAL   LONG TERM GOALS: Target Date: 06/03/2024  Pt will improve functional ability by decreased impairment per PSFS assessment from 4.6 to 6.6 or better, for better quality of life. Goal status: INITIAL  2.  Pt will improve grip strength in  right hand from 27 lbs to at least 35 lbs for functional use at home and in IADLs. Goal status: INITIAL  3.  Pt will improve A/ROM in right wrist extension from 61 degrees to at least 70 degrees, to have functional motion for tasks like reach and grasp.  Goal status: INITIAL  4.  Pt will decrease pain at worst from 8-9/10 to 3/10 or better to have better sleep and occupational participation in daily roles. Goal status: INITIAL   ASSESSMENT:  CLINICAL IMPRESSION: Patient is a 57 y.o. female who was seen today for occupational therapy evaluation for stiffness, swelling, pain and hypersensitivity in the right hand after middle finger trigger finger release and carpal tunnel releases.  The patient will benefit from outpatient occupational therapy to decrease symptoms, improve functional upper extremity use, and increase quality of life.  PERFORMANCE DEFICITS: in functional skills including IADLs, coordination, edema, ROM, strength, pain, fascial restrictions, body mechanics, endurance, and UE functional use, cognitive skills including problem solving and safety awareness, and psychosocial skills including coping strategies, environmental adaptation, habits, and routines and behaviors.   IMPAIRMENTS: are limiting patient from ADLs, IADLs, rest and sleep, and leisure.   COMORBIDITIES: may have co-morbidities  that affects occupational performance. Patient will benefit from skilled OT to address above impairments and improve overall function.  MODIFICATION OR ASSISTANCE TO  COMPLETE EVALUATION: No modification of tasks or assist necessary to complete an evaluation.  OT OCCUPATIONAL PROFILE AND HISTORY: Problem focused assessment: Including review of records relating to presenting problem.  CLINICAL DECISION MAKING: Moderate - several treatment options, min-mod task modification necessary  REHAB POTENTIAL: Excellent  EVALUATION COMPLEXITY: Low      PLAN:  OT FREQUENCY: 1-2x/week  OT DURATION: 4 weeks through 06/03/2024 and up to 5 total visits as needed   PLANNED INTERVENTIONS: 97535 self care/ADL training, [id number] therapeutic exercise, 97530 therapeutic activity, 97112 neuromuscular re-education, 97140 manual therapy, 97035 ultrasound, 97032 electrical stimulation (manual), 97760 Orthotic Initial, [id number] Orthotic/Prosthetic subsequent, compression bandaging, Dry needling, energy conservation, coping strategies training, and patient/family education  RECOMMENDED OTHER SERVICES: none now    CONSULTED AND AGREED WITH PLAN OF CARE: Patient  PLAN FOR NEXT SESSION:   Review initial HEP and recommendations, add in moist heat and manual therapy as helpful, consider KT taping.   Melvenia Ada, OTR/L, CHT  05/04/2024, 2:15 PM

## 2024-05-04 ENCOUNTER — Ambulatory Visit (INDEPENDENT_AMBULATORY_CARE_PROVIDER_SITE_OTHER): Admitting: Rehabilitative and Restorative Service Providers"

## 2024-05-04 ENCOUNTER — Encounter: Payer: Self-pay | Admitting: Rehabilitative and Restorative Service Providers"

## 2024-05-04 DIAGNOSIS — M6281 Muscle weakness (generalized): Secondary | ICD-10-CM | POA: Diagnosis not present

## 2024-05-04 DIAGNOSIS — R202 Paresthesia of skin: Secondary | ICD-10-CM | POA: Diagnosis not present

## 2024-05-04 DIAGNOSIS — M79641 Pain in right hand: Secondary | ICD-10-CM | POA: Diagnosis not present

## 2024-05-04 DIAGNOSIS — R600 Localized edema: Secondary | ICD-10-CM | POA: Diagnosis not present

## 2024-05-04 DIAGNOSIS — M25641 Stiffness of right hand, not elsewhere classified: Secondary | ICD-10-CM

## 2024-05-10 NOTE — Therapy (Signed)
 OUTPATIENT OCCUPATIONAL THERAPY TREATMENT NOTE   Patient Name: Beth Branch MRN: [id number] DOB:1965-11-01, 58 y.o., female Today's Date: 05/12/2024  PCP: Toppin, Jean M, MD  REFERRING PROVIDER: Toppin, Jean M, MD   END OF SESSION:  OT End of Session - 05/12/24 1558     Visit Number 2    Number of Visits 5    Date for OT Re-Evaluation 06/03/24    Authorization Type [employer]    OT Start Time 1600    OT Stop Time 1653    OT Time Calculation (min) 53 min    Activity Tolerance Patient tolerated treatment well;No increased pain;Patient limited by fatigue;Patient limited by pain    Behavior During Therapy [facility] for tasks assessed/performed           Past Medical History:  Diagnosis Date   Adopted    BRCA negative 04-05-10   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 S/p excision by Beth Branch [facility] 12/18/2015.   Complication of anesthesia    REFLUX AFTER ANESTHESIA   Cyst of tendon sheath 09/28/2012   Overview:  L hand, along the extensor tendons of the long and ring fingers ->[facility] 09/2012. Opted to observe.    Degenerative disc disease, cervical 11/16/2016   Overview:  Had neck pain ->>Plain films 09/2015 (referred to PT).    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 Followed by Dr. Arloa - OBGYN.    Keloid of skin 12/26/2013   Liver cyst 11/16/2016   Overview:  Has small benign appearing liver cysts on CT A/P + MRI Liver (2017). Had consult w/ Biliary at [facility] and found to also have a benign hemangioma.    Pap  smear abnormality of cervix with ASCUS favoring benign 08/20/2015   Postinflammatory hyperpigmentation 12/12/2013   Seborrheic dermatitis 03/01/2014   Past Surgical History:  Procedure Laterality Date   BREAST RECONSTRUCTION Bilateral 2011   Dr Beth Branch in [place]   CHOLECYSTECTOMY     COMPLETE MASTECTOMY W/ SENTINEL NODE BIOPSY Bilateral 2010   Right breast DCIS   CYSTOSCOPY WITH URETHRAL DILATATION N/A 09/15/2017   Procedure: CYSTOSCOPY WITH URETHRAL DILATATION;  Surgeon: Beth Glendia BROCKS, MD;  Location: [facility] ORS;  Service: Urology;  Laterality: N/A;   EXCISION MASS ABDOMINAL N/A 12/26/2015   EXCISION MASS ABDOMINAL; CAVERNOUS HEMANGIOMA Beth Beth Muse, MD;  Location: [facility] ORS;  Service: General;  Laterality: N/A;   KNEE ARTHROSCOPY Right    Patient Active Problem List   Diagnosis Date Noted   S/P bilateral mastectomy 11/29/2018   Screen for colon cancer 06/23/2018   Gross hematuria 04/15/2018   GERD (gastroesophageal reflux disease) 02/03/2018   Personal history of urethral stricture 10/14/2017   Vitamin D deficiency 10/08/2017   Urinary frequency 08/14/2017   Incomplete bladder emptying 08/14/2017   Urethral bleeding 08/14/2017   Hemangioma 08/13/2017   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   Right hip pain 04/24/2017   Atypical glandular cells of undetermined significance (AGUS) on cervical Pap smear  02/23/2017   Degenerative disc disease, cervical 11/16/2016   Liver cyst 11/16/2016   Acute pain of left knee 11/11/2016   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   Breast neoplasm, 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   History of abnormal cervical Pap smear 09/14/2015   History of breast cancer 09/11/2015   Intramural leiomyoma of uterus 09/11/2015   Ductal carcinoma in situ (DCIS) of right breast 09/04/2015   Pap smear abnormality of cervix with ASCUS favoring benign 08/20/2015   Vaginal discharge 08/20/2015   Abscess of arm, right 03/01/2014   Seborrheic dermatitis 03/01/2014    Keloid of skin 12/26/2013   Acne 12/12/2013   Postinflammatory hyperpigmentation 12/12/2013   History of myomectomy 10/12/2013   Body mass index (BMI) of 28.0-28.9 in adult 10/11/2013   Dysmenorrhea 10/11/2013   Fibroids, intramural 10/11/2013   Personal history of breast cancer 10/11/2013   Screening for cervical cancer 10/11/2013   History of blood clots    Breast cancer (HCC)    Cyst of tendon sheath 09/28/2012    ONSET DATE: DOS approx 03/30/24  REFERRING DIAG: G56.03 (ICD-10-CM) - Carpal tunnel syndrome, bilateral upper limbs   THERAPY DIAG:  Paresthesia of skin  Localized edema  Muscle weakness (generalized)  Stiffness of right hand, not elsewhere classified  Pain in right hand  Rationale for Evaluation and Treatment: Rehabilitation  PERTINENT HISTORY: History of ganglion cysts, de Quervain's tenosynovitis, also some triggering in the left middle finger.  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  She was in PT which helped, but numbness came back. She eventually had Rt CTR and MF TFR, and did not get formal therapy.   She states lingering pain and stiffness in MF and wrist/hand now approx 5 weeks post-op.   Paresthesia is gone, and she hasn't triggered.   She does have a Lt hand MF trigger finger as well, that has not been treated yet.  Has had hx of ganglion cysts.  She has had deQuervain's in the past.  She works from home and types a lot!  She does wake up in pain in the night.   PRECAUTIONS: None  RED FLAGS: None   WEIGHT BEARING RESTRICTIONS: No   SUBJECTIVE:   SUBJECTIVE STATEMENT: 6 weeks s/p Rt MF TFR and CTR.  She states overall feeling less pain at worst and less pain at rest, but still having some soreness around the thumb and in the PIP joint of the middle finger at times.  Gripping and using her hand is still a bit agitating at times.  The Band-Aid trick is  working on the left hand to prevent triggering.Beth Branch     PAIN:  Are you having pain? Yes: NPRS scale: 0-1/10 at rest and at worst in the past week up to 4-5/10 with daily tasks, work Pain location: Right hand surgical release areas Pain description: Aching and sometimes sharp Aggravating factors: Repetitive heavy gripping etc. Relieving factors: Heat    PATIENT GOALS: To improve pain and use of right dominant hand  NEXT MD VISIT: As needed   OBJECTIVE: (All objective assessments below are from initial evaluation on: 05/04/24 unless otherwise specified.)   HAND DOMINANCE: Right   ADLs: Overall ADLs: States decreased ability to grab, hold household objects, pain and difficulty to open containers, perform FMS tasks (manipulate fasteners on clothing)   FUNCTIONAL OUTCOME MEASURES: Eval: Patient Specific Functional Scale: 4.6 (typing, day-to-day activities, exercising with  hand)  (Higher Score  =  Better Ability for the Selected Tasks)       UPPER EXTREMITY ROM     Shoulder to Wrist AROM Right eval Left eval Rt  05/12/24  Forearm supination 85    Forearm pronation  90    Wrist flexion 73 78 80   Wrist extension 61 75 68   (Blank rows = not tested)   Hand AROM Right eval Rt 05/12/24  Full Fist Ability (or Gap to Distal Palmar Crease) Full but painful fist   Thumb Opposition  (Kapandji Scale)  9/10   Long MCP (0-90)  0- 77 0- 78  Long PIP (0-100) (-7) - 95 (-4) - 94  Long DIP (0-70) 0-  67 0 - 63  (Blank rows = not tested)   HAND FUNCTION: 05/12/24: Grip: 32#    Eval: Observed weakness in affected Rt hand.  Grip strength Right: 27 lbs tender, Left: 37 lbs   COORDINATION: Eval: No significant coordination impairments other than inhibiting pain at the hand and wrist and some stiffness  SENSATION: Eval:  Light touch intact today,   though a bit hypersensitive around surgical areas and they are still a bit swollen and puffy looking today  EDEMA:   Eval:  Mildly  swollen in rt hand and wrist today  OBSERVATIONS:   Eval: Surgical site is well-healing though slightly hypersensitive and swollen still.  She has some stiffness to the middle finger in flexion and extension as well as in the wrist.  Right carpal tunnel and middle finger trigger finger release   TODAY'S TREATMENT:  05/12/24: She starts with active range of motion for exercise as well as new measures which shows excellent improvements in wrist motion and some mild improvements in thumb and finger motion as well.  While she is on moist heat for approximately 4 minutes, we review her home exercises and recommendations together.  OT then does cupping over the scars for mobilization and desensitization which she finds somewhat painful but her body accommodates to.  She has no lingering pain and feels better afterward.  We then perform her stretches for her fingers and her wrist, tendon gliding, nerve gliding, and new tolerable isometric gripping and pinching as bolded below.  She is told to add these into her plan 2 or 3 times a day as tolerated and to do without added pain.  She does well with them today and states understanding.  She will return in 2 weeks as she will be out of town next week.    Exercises - Wrist Prayer Stretch  - 4 x daily - 3-5 reps - 15 sec hold - BACK KNUCKLE STRETCHES   - 4 x daily - 3-5 reps - 15 sec hold - Seated Finger Composite Flexion Stretch  - 4 x daily - 3-5 reps - 15 hold - PUSH KNUCKLES DOWN  - 4 x daily - 3-5 reps - 15 seconds hold - Tendon Glides  - 4-6 x daily - 3-5 reps - 2-3 seconds hold - Thumb Opposition  - 4-6 x daily - 10 reps - Median Nerve Flossing  - 2-3 x daily - 5-10 reps - HOOK Stretch  - 4 x daily - 3-5 reps - 15-20 sec hold - Towel Roll Grip with Forearm in Neutral  - 3 x daily - 5 reps - 10 sec hold - Duck Mouth Strength  - 2-3 x daily - 5 reps - Thumb Press  - 2-3 x daily -  5 reps   PATIENT EDUCATION: Education details: See tx section  above for details  Person educated: Patient Education method: Verbal Instruction, Teach back, Handouts  Education comprehension: States and demonstrates understanding, Additional Education required    HOME EXERCISE PROGRAM: Access Code: [email or web address] URL: [email or web address] Date: 05/04/2024 Prepared by: Melvenia Ada   GOALS: Goals reviewed with patient? Yes   SHORT TERM GOALS: (STG required if POC>30 days) Target Date: 05/13/2024  Pt will obtain protective, custom orthotic. Goal status: TBD/PRN  2.  Pt will demo/state understanding of initial HEP to improve pain levels and prerequisite motion. Goal status: 05/12/24: MET   LONG TERM GOALS: Target Date: 06/03/2024  Pt will improve functional ability by decreased impairment per PSFS assessment from 4.6 to 6.6 or better, for better quality of life. Goal status: INITIAL  2.  Pt will improve grip strength in right hand from 27 lbs to at least 35 lbs for functional use at home and in IADLs. Goal status: INITIAL  3.  Pt will improve A/ROM in right wrist extension from 61 degrees to at least 70 degrees, to have functional motion for tasks like reach and grasp.  Goal status: INITIAL  4.  Pt will decrease pain at worst from 8-9/10 to 3/10 or better to have better sleep and occupational participation in daily roles. Goal status: INITIAL   ASSESSMENT:  CLINICAL IMPRESSION: 05/12/24: She is having much less pain, moving her wrist much better, tolerating light gripping and pinching now.  She has been managing left nonop trigger finger without significant issues.  Carry on      PLAN:  OT FREQUENCY: 1-2x/week  OT DURATION: 4 weeks through 06/03/2024 and up to 5 total visits as needed   PLANNED INTERVENTIONS: 97535 self care/ADL training, 02889 therapeutic exercise, 97530 therapeutic activity, 97112 neuromuscular re-education, 97140 manual therapy, 97035 ultrasound, 97032 electrical stimulation (manual), 97760  Orthotic Initial, [id number] Orthotic/Prosthetic subsequent, compression bandaging, Dry needling, energy conservation, coping strategies training, and patient/family education  CONSULTED AND AGREED WITH PLAN OF CARE: Patient  PLAN FOR NEXT SESSION:   Check motion, tenderness consider moist heat, manual therapy, K taping or other options as needed.  If she is doing well and her strength is better and all symptoms are resolved we can likely discharge therapy whenever that occurs.   Melvenia Ada, OTR/L, CHT  05/12/2024, 5:00 PM

## 2024-05-12 ENCOUNTER — Encounter: Payer: Self-pay | Admitting: Rehabilitative and Restorative Service Providers"

## 2024-05-12 ENCOUNTER — Ambulatory Visit: Admitting: Rehabilitative and Restorative Service Providers"

## 2024-05-12 DIAGNOSIS — M79641 Pain in right hand: Secondary | ICD-10-CM

## 2024-05-12 DIAGNOSIS — R600 Localized edema: Secondary | ICD-10-CM

## 2024-05-12 DIAGNOSIS — R202 Paresthesia of skin: Secondary | ICD-10-CM | POA: Diagnosis not present

## 2024-05-12 DIAGNOSIS — M25641 Stiffness of right hand, not elsewhere classified: Secondary | ICD-10-CM

## 2024-05-12 DIAGNOSIS — M6281 Muscle weakness (generalized): Secondary | ICD-10-CM | POA: Diagnosis not present

## 2024-05-25 NOTE — Therapy (Signed)
 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 S/p excision by Beth Branch 12/18/2015.   Complication of anesthesia    REFLUX AFTER ANESTHESIA   Cyst of tendon sheath 09/28/2012   Overview:  L hand, along the extensor tendons of the long and ring fingers ->[facility] 09/2012. Opted to observe.    Degenerative disc disease, cervical 11/16/2016   Overview:  Had neck pain ->>Plain films 09/2015 (referred to PT).    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 Followed by Beth. Arloa - OBGYN.    Keloid of skin 12/26/2013   Liver cyst 11/16/2016   Overview:  Has small benign appearing liver cysts on CT A/P + MRI Liver (2017). Had consult w/ Biliary at [facility] and found to also have a benign hemangioma.    Pap smear abnormality of cervix with ASCUS favoring benign 08/20/2015   Postinflammatory hyperpigmentation 12/12/2013   Seborrheic dermatitis 03/01/2014   Past Surgical History:  Procedure Laterality Date   BREAST RECONSTRUCTION Bilateral 2011   Beth Branch in [place]   CHOLECYSTECTOMY     COMPLETE MASTECTOMY W/ SENTINEL NODE BIOPSY Bilateral 2010   Right breast DCIS   CYSTOSCOPY WITH URETHRAL DILATATION N/A 09/15/2017    Procedure: CYSTOSCOPY WITH URETHRAL DILATATION;  Surgeon: Beth Glendia BROCKS, MD;  Location: [facility] ORS;  Service: Urology;  Laterality: N/A;   EXCISION MASS ABDOMINAL N/A 12/26/2015   EXCISION MASS ABDOMINAL; CAVERNOUS HEMANGIOMA Beth KANDICE Muse, MD;  Location: [facility] ORS;  Service: Branch;  Laterality: N/A;   KNEE ARTHROSCOPY Right    Patient Active Problem List   Diagnosis Date Noted   S/P bilateral mastectomy 11/29/2018   Screen for colon cancer 06/23/2018   Gross hematuria 04/15/2018   GERD (gastroesophageal reflux disease) 02/03/2018   Personal history of urethral stricture 10/14/2017   Vitamin D deficiency 10/08/2017   Urinary frequency 08/14/2017   Incomplete bladder emptying 08/14/2017   Urethral bleeding 08/14/2017   Hemangioma 08/13/2017   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   Right hip pain 04/24/2017   Atypical glandular cells of undetermined significance (AGUS) on cervical Pap smear 02/23/2017   Degenerative disc disease, cervical 11/16/2016   Liver cyst 11/16/2016   Acute pain of left knee 11/11/2016   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   Breast neoplasm, 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   History of abnormal cervical Pap smear 09/14/2015   History of breast cancer 09/11/2015   Intramural leiomyoma of uterus 09/11/2015   Ductal carcinoma in situ (DCIS) of right breast 09/04/2015   Pap smear abnormality of cervix with ASCUS favoring benign 08/20/2015   Vaginal discharge 08/20/2015   Abscess of arm, right 03/01/2014  Seborrheic dermatitis 03/01/2014   Keloid of skin 12/26/2013   Acne 12/12/2013   Postinflammatory hyperpigmentation 12/12/2013   History of myomectomy 10/12/2013   Body mass index (BMI) of 28.0-28.9 in adult 10/11/2013   Dysmenorrhea 10/11/2013   Fibroids, intramural 10/11/2013   Personal history of breast cancer 10/11/2013   Screening for cervical cancer 10/11/2013   History of blood clots    Breast cancer (HCC)    Cyst of tendon sheath  09/28/2012    ONSET DATE: DOS approx 03/30/24  REFERRING DIAG: G56.03 (ICD-10-CM) - Carpal tunnel syndrome, bilateral upper limbs   THERAPY DIAG:  No diagnosis found.  Rationale for Evaluation and Treatment: Rehabilitation  PERTINENT HISTORY: History of ganglion cysts, de Quervain's tenosynovitis, also some triggering in the left middle finger.  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  She was in PT which helped, but numbness came back. She eventually had Rt CTR and MF TFR, and did not get formal therapy.   She states lingering pain and stiffness in MF and wrist/hand now approx 5 weeks post-op.   Paresthesia is gone, and she hasn't triggered.   She does have a Lt hand MF trigger finger as well, that has not been treated yet.  Has had hx of ganglion cysts.  She has had deQuervain's in the past.  She works from home and types a lot!  She does wake up in pain in the night.   PRECAUTIONS: None  RED FLAGS: None   WEIGHT BEARING RESTRICTIONS: No   SUBJECTIVE:   SUBJECTIVE STATEMENT: 7 weeks s/p Rt MF TFR and CTR.  She states ***   overall feeling less pain at worst and less pain at rest, but still having some soreness around the thumb and in the PIP joint of the middle finger at times.  Gripping and using her hand is still a bit agitating at times.  The Band-Aid trick is working on the left hand to prevent triggering.[name]     PAIN:  Are you having pain? Yes: NPRS scale: *** 0-1/10 at rest and at worst in the past week up to 4-5/10 with daily tasks, work Pain location: Right hand surgical release areas Pain description: Aching and sometimes sharp Aggravating factors: Repetitive heavy gripping etc. Relieving factors: Heat    PATIENT GOALS: To improve pain and use of right dominant hand  NEXT MD VISIT: As needed   OBJECTIVE: (All objective assessments below are from initial evaluation  on: 05/04/24 unless otherwise specified.)   HAND DOMINANCE: Right   ADLs: Overall ADLs: States decreased ability to grab, hold household objects, pain and difficulty to open containers, perform FMS tasks (manipulate fasteners on clothing)   FUNCTIONAL OUTCOME MEASURES: 05/26/24: PSFS ***   Eval: Patient Specific Functional Scale: 4.6 (typing, day-to-day activities, exercising with hand)  (Higher Score  =  Better Ability for the Selected Tasks)       UPPER EXTREMITY ROM     Shoulder to Wrist AROM Right eval Left eval Rt  05/12/24 Rt 05/26/24  Forearm supination 85     Forearm pronation  90     Wrist flexion 73 78 80  ***  Wrist extension 61 75 68  ***  (Blank rows = not tested)   Hand AROM Right eval Rt 05/12/24 Rt 05/26/24  Full Fist Ability (or Gap to Distal Palmar Crease) Full but painful fist    Thumb  Opposition  (Kapandji Scale)  9/10    Long MCP (0-90)  0- 77 0- 78 0 - ***  Long PIP (0-100) (-7) - 95 (-4) - 94 *** - ***  Long DIP (0-70) 0-  67 0 - 63 *** - ***  (Blank rows = not tested)   HAND FUNCTION: 05/12/24: Rt Grip: 32#    Eval: Observed weakness in affected Rt hand.  Grip strength Right: 27 lbs tender, Left: 37 lbs   COORDINATION: Eval: No significant coordination impairments other than inhibiting pain at the hand and wrist and some stiffness  SENSATION: Eval:  Light touch intact today,   though a bit hypersensitive around surgical areas and they are still a bit swollen and puffy looking today  EDEMA:   Eval:  Mildly swollen in rt hand and wrist today  OBSERVATIONS:   Eval: Surgical site is well-healing though slightly hypersensitive and swollen still.  She has some stiffness to the middle finger in flexion and extension as well as in the wrist.  Right carpal tunnel and middle finger trigger finger release   TODAY'S TREATMENT:  05/26/24: *** Check motion, tenderness consider moist heat, manual therapy, K taping or other options as needed.  If she is  doing well and her strength is better and all symptoms are resolved we can likely discharge therapy whenever that occurs.    05/12/24: She starts with active range of motion for exercise as well as new measures which shows excellent improvements in wrist motion and some mild improvements in thumb and finger motion as well.  While she is on moist heat for approximately 4 minutes, we review her home exercises and recommendations together.  OT then does cupping over the scars for mobilization and desensitization which she finds somewhat painful but her body accommodates to.  She has no lingering pain and feels better afterward.  We then perform her stretches for her fingers and her wrist, tendon gliding, nerve gliding, and new tolerable isometric gripping and pinching as bolded below.  She is told to add these into her plan 2 or 3 times a day as tolerated and to do without added pain.  She does well with them today and states understanding.  She will return in 2 weeks as she will be out of town next week.    Exercises - Wrist Prayer Stretch  - 4 x daily - 3-5 reps - 15 sec hold - BACK KNUCKLE STRETCHES   - 4 x daily - 3-5 reps - 15 sec hold - Seated Finger Composite Flexion Stretch  - 4 x daily - 3-5 reps - 15 hold - PUSH KNUCKLES DOWN  - 4 x daily - 3-5 reps - 15 seconds hold - Tendon Glides  - 4-6 x daily - 3-5 reps - 2-3 seconds hold - Thumb Opposition  - 4-6 x daily - 10 reps - Median Nerve Flossing  - 2-3 x daily - 5-10 reps - HOOK Stretch  - 4 x daily - 3-5 reps - 15-20 sec hold - Towel Roll Grip with Forearm in Neutral  - 3 x daily - 5 reps - 10 sec hold - Duck Mouth Strength  - 2-3 x daily - 5 reps - Thumb Press  - 2-3 x daily - 5 reps   PATIENT EDUCATION: Education details: See tx section above for details  Person educated: Patient Education method: Verbal Instruction, Teach back, Handouts  Education comprehension: States and demonstrates understanding, Additional Education required     HOME  EXERCISE PROGRAM: Access Code: [email or web address] URL: [email or web address] Date: 05/04/2024 Prepared by: Melvenia Ada   GOALS: Goals reviewed with patient? Yes   SHORT TERM GOALS: (STG required if POC>30 days) Target Date: 05/13/2024  Pt will obtain protective, custom orthotic. Goal status: TBD/PRN  2.  Pt will demo/state understanding of initial HEP to improve pain levels and prerequisite motion. Goal status: 05/12/24: MET   LONG TERM GOALS: Target Date: 06/03/2024  Pt will improve functional ability by decreased impairment per PSFS assessment from 4.6 to 6.6 or better, for better quality of life. Goal status: INITIAL  2.  Pt will improve grip strength in right hand from 27 lbs to at least 35 lbs for functional use at home and in IADLs. Goal status: INITIAL  3.  Pt will improve A/ROM in right wrist extension from 61 degrees to at least 70 degrees, to have functional motion for tasks like reach and grasp.  Goal status: INITIAL  4.  Pt will decrease pain at worst from 8-9/10 to 3/10 or better to have better sleep and occupational participation in daily roles. Goal status: INITIAL   ASSESSMENT:  CLINICAL IMPRESSION: 05/26/24: ***  05/12/24: She is having much less pain, moving her wrist much better, tolerating light gripping and pinching now.  She has been managing left nonop trigger finger without significant issues.  Carry on      PLAN:  OT FREQUENCY: 1-2x/week  OT DURATION: 4 weeks through 06/03/2024 and up to 5 total visits as needed   PLANNED INTERVENTIONS: 97535 self care/ADL training, 02889 therapeutic exercise, 97530 therapeutic activity, 97112 neuromuscular re-education, 97140 manual therapy, 97035 ultrasound, 97032 electrical stimulation (manual), 97760 Orthotic Initial, [id number] Orthotic/Prosthetic subsequent, compression bandaging, Dry needling, energy conservation, coping strategies training, and patient/family education  CONSULTED AND  AGREED WITH PLAN OF CARE: Patient  PLAN FOR NEXT SESSION:   ***  Melvenia Ada, OTR/L, CHT  05/25/2024, 6:16 PM

## 2024-05-26 ENCOUNTER — Ambulatory Visit: Admitting: Rehabilitative and Restorative Service Providers"

## 2024-05-26 ENCOUNTER — Encounter: Payer: Self-pay | Admitting: Rehabilitative and Restorative Service Providers"

## 2024-05-26 DIAGNOSIS — R202 Paresthesia of skin: Secondary | ICD-10-CM

## 2024-05-26 DIAGNOSIS — M6281 Muscle weakness (generalized): Secondary | ICD-10-CM | POA: Diagnosis not present

## 2024-05-26 DIAGNOSIS — M25641 Stiffness of right hand, not elsewhere classified: Secondary | ICD-10-CM

## 2024-05-26 DIAGNOSIS — M79641 Pain in right hand: Secondary | ICD-10-CM

## 2024-05-26 DIAGNOSIS — R600 Localized edema: Secondary | ICD-10-CM | POA: Diagnosis not present
